# Patient Record
Sex: Male | Born: 1945 | Race: White | Marital: Married
Health system: Southern US, Community
[De-identification: ages and names within clinical notes are randomized; demographics above are authoritative.]

## PROBLEM LIST (undated history)

## (undated) DIAGNOSIS — E785 Hyperlipidemia, unspecified: Secondary | ICD-10-CM

## (undated) DIAGNOSIS — J449 Chronic obstructive pulmonary disease, unspecified: Secondary | ICD-10-CM

## (undated) DIAGNOSIS — I10 Essential (primary) hypertension: Secondary | ICD-10-CM

## (undated) DIAGNOSIS — I2510 Atherosclerotic heart disease of native coronary artery without angina pectoris: Secondary | ICD-10-CM

## (undated) DIAGNOSIS — E119 Type 2 diabetes mellitus without complications: Secondary | ICD-10-CM

## (undated) DIAGNOSIS — I7121 Aneurysm of the ascending aorta, without rupture: Secondary | ICD-10-CM

## (undated) DIAGNOSIS — N182 Chronic kidney disease, stage 2 (mild): Secondary | ICD-10-CM

## (undated) DIAGNOSIS — I712 Thoracic aortic aneurysm, without rupture: Secondary | ICD-10-CM

## (undated) HISTORY — DX: Type 2 diabetes mellitus without complications: E11.9

## (undated) HISTORY — DX: Hyperlipidemia, unspecified: E78.5

## (undated) HISTORY — DX: Chronic obstructive pulmonary disease, unspecified: J44.9

## (undated) HISTORY — DX: Essential (primary) hypertension: I10

---

## 2003-11-15 ENCOUNTER — Ambulatory Visit: Payer: Self-pay | Admitting: Family Medicine

## 2003-12-13 ENCOUNTER — Ambulatory Visit: Payer: Self-pay | Admitting: Family Medicine

## 2003-12-14 ENCOUNTER — Ambulatory Visit: Payer: Self-pay | Admitting: Family Medicine

## 2004-01-04 ENCOUNTER — Ambulatory Visit: Payer: Self-pay | Admitting: Family Medicine

## 2004-03-06 ENCOUNTER — Ambulatory Visit: Payer: Self-pay | Admitting: Family Medicine

## 2004-04-12 ENCOUNTER — Ambulatory Visit: Payer: Self-pay | Admitting: Family Medicine

## 2004-05-10 ENCOUNTER — Ambulatory Visit: Payer: Self-pay | Admitting: Family Medicine

## 2004-06-12 ENCOUNTER — Ambulatory Visit: Payer: Self-pay | Admitting: Family Medicine

## 2004-08-23 ENCOUNTER — Ambulatory Visit: Payer: Self-pay | Admitting: Family Medicine

## 2004-10-16 ENCOUNTER — Ambulatory Visit: Payer: Self-pay | Admitting: Family Medicine

## 2004-11-19 ENCOUNTER — Ambulatory Visit: Payer: Self-pay | Admitting: Family Medicine

## 2004-12-11 ENCOUNTER — Ambulatory Visit: Payer: Self-pay | Admitting: Family Medicine

## 2004-12-25 ENCOUNTER — Ambulatory Visit: Payer: Self-pay | Admitting: Family Medicine

## 2005-02-25 ENCOUNTER — Ambulatory Visit: Payer: Self-pay | Admitting: Family Medicine

## 2005-03-10 ENCOUNTER — Ambulatory Visit: Payer: Self-pay | Admitting: Family Medicine

## 2010-03-04 ENCOUNTER — Other Ambulatory Visit: Payer: Self-pay

## 2010-03-04 DIAGNOSIS — M25551 Pain in right hip: Secondary | ICD-10-CM

## 2010-03-04 DIAGNOSIS — M79604 Pain in right leg: Secondary | ICD-10-CM

## 2010-03-04 DIAGNOSIS — M545 Low back pain, unspecified: Secondary | ICD-10-CM

## 2010-03-06 ENCOUNTER — Other Ambulatory Visit: Payer: Self-pay

## 2010-03-06 ENCOUNTER — Ambulatory Visit
Admission: RE | Admit: 2010-03-06 | Discharge: 2010-03-06 | Disposition: A | Discharge status: Home / Self Care | Payer: Medicare HMO | Source: Ambulatory Visit

## 2010-03-06 DIAGNOSIS — M545 Low back pain: Secondary | ICD-10-CM

## 2010-03-06 DIAGNOSIS — M79604 Pain in right leg: Secondary | ICD-10-CM

## 2010-03-06 DIAGNOSIS — M25551 Pain in right hip: Secondary | ICD-10-CM

## 2010-03-20 ENCOUNTER — Ambulatory Visit
Admission: RE | Admit: 2010-03-20 | Discharge: 2010-03-20 | Disposition: A | Discharge status: Home / Self Care | Payer: Medicare HMO | Source: Ambulatory Visit

## 2010-03-20 DIAGNOSIS — M545 Low back pain: Secondary | ICD-10-CM

## 2010-03-26 ENCOUNTER — Other Ambulatory Visit: Payer: Medicare HMO

## 2010-08-01 DIAGNOSIS — G8929 Other chronic pain: Secondary | ICD-10-CM

## 2010-08-01 DIAGNOSIS — M545 Low back pain, unspecified: Secondary | ICD-10-CM

## 2010-08-01 HISTORY — DX: Other chronic pain: G89.29

## 2010-08-01 HISTORY — DX: Low back pain, unspecified: M54.50

## 2015-05-08 DIAGNOSIS — E039 Hypothyroidism, unspecified: Secondary | ICD-10-CM | POA: Insufficient documentation

## 2015-05-08 DIAGNOSIS — E782 Mixed hyperlipidemia: Secondary | ICD-10-CM | POA: Insufficient documentation

## 2015-05-08 DIAGNOSIS — H409 Unspecified glaucoma: Secondary | ICD-10-CM

## 2015-05-08 DIAGNOSIS — J4520 Mild intermittent asthma, uncomplicated: Secondary | ICD-10-CM

## 2015-05-08 DIAGNOSIS — K219 Gastro-esophageal reflux disease without esophagitis: Secondary | ICD-10-CM

## 2015-05-08 DIAGNOSIS — I10 Essential (primary) hypertension: Secondary | ICD-10-CM

## 2015-05-08 DIAGNOSIS — G2581 Restless legs syndrome: Secondary | ICD-10-CM

## 2015-05-08 DIAGNOSIS — E119 Type 2 diabetes mellitus without complications: Secondary | ICD-10-CM

## 2015-05-08 DIAGNOSIS — Z794 Long term (current) use of insulin: Secondary | ICD-10-CM

## 2015-05-08 HISTORY — DX: Mild intermittent asthma, uncomplicated: J45.20

## 2015-05-08 HISTORY — DX: Restless legs syndrome: G25.81

## 2015-05-08 HISTORY — DX: Long term (current) use of insulin: Z79.4

## 2015-05-08 HISTORY — DX: Essential (primary) hypertension: I10

## 2015-05-08 HISTORY — DX: Gastro-esophageal reflux disease without esophagitis: K21.9

## 2015-05-08 HISTORY — DX: Type 2 diabetes mellitus without complications: E11.9

## 2015-05-08 HISTORY — DX: Mixed hyperlipidemia: E78.2

## 2015-05-08 HISTORY — DX: Unspecified glaucoma: H40.9

## 2015-05-08 HISTORY — DX: Hypothyroidism, unspecified: E03.9

## 2015-11-20 DIAGNOSIS — Z9989 Dependence on other enabling machines and devices: Secondary | ICD-10-CM | POA: Insufficient documentation

## 2015-11-20 DIAGNOSIS — G4733 Obstructive sleep apnea (adult) (pediatric): Secondary | ICD-10-CM

## 2015-11-20 HISTORY — DX: Obstructive sleep apnea (adult) (pediatric): G47.33

## 2016-02-16 DIAGNOSIS — N529 Male erectile dysfunction, unspecified: Secondary | ICD-10-CM | POA: Insufficient documentation

## 2016-02-16 HISTORY — DX: Male erectile dysfunction, unspecified: N52.9

## 2017-02-11 DIAGNOSIS — A419 Sepsis, unspecified organism: Secondary | ICD-10-CM

## 2017-02-11 DIAGNOSIS — R7881 Bacteremia: Secondary | ICD-10-CM

## 2017-02-11 DIAGNOSIS — E1165 Type 2 diabetes mellitus with hyperglycemia: Secondary | ICD-10-CM

## 2017-02-11 DIAGNOSIS — R4182 Altered mental status, unspecified: Secondary | ICD-10-CM | POA: Diagnosis not present

## 2017-02-12 DIAGNOSIS — E1165 Type 2 diabetes mellitus with hyperglycemia: Secondary | ICD-10-CM | POA: Diagnosis not present

## 2017-02-12 DIAGNOSIS — R4182 Altered mental status, unspecified: Secondary | ICD-10-CM | POA: Diagnosis not present

## 2017-02-12 DIAGNOSIS — A419 Sepsis, unspecified organism: Secondary | ICD-10-CM | POA: Diagnosis not present

## 2017-02-12 DIAGNOSIS — R7881 Bacteremia: Secondary | ICD-10-CM | POA: Diagnosis not present

## 2017-02-13 DIAGNOSIS — R4182 Altered mental status, unspecified: Secondary | ICD-10-CM | POA: Diagnosis not present

## 2017-02-13 DIAGNOSIS — E1165 Type 2 diabetes mellitus with hyperglycemia: Secondary | ICD-10-CM | POA: Diagnosis not present

## 2017-02-13 DIAGNOSIS — R7881 Bacteremia: Secondary | ICD-10-CM | POA: Diagnosis not present

## 2017-02-13 DIAGNOSIS — A419 Sepsis, unspecified organism: Secondary | ICD-10-CM | POA: Diagnosis not present

## 2017-02-14 DIAGNOSIS — A419 Sepsis, unspecified organism: Secondary | ICD-10-CM | POA: Diagnosis not present

## 2017-02-14 DIAGNOSIS — R4182 Altered mental status, unspecified: Secondary | ICD-10-CM | POA: Diagnosis not present

## 2017-02-14 DIAGNOSIS — E1165 Type 2 diabetes mellitus with hyperglycemia: Secondary | ICD-10-CM | POA: Diagnosis not present

## 2017-02-14 DIAGNOSIS — R7881 Bacteremia: Secondary | ICD-10-CM | POA: Diagnosis not present

## 2017-08-26 DIAGNOSIS — J449 Chronic obstructive pulmonary disease, unspecified: Secondary | ICD-10-CM | POA: Diagnosis not present

## 2017-08-26 DIAGNOSIS — E1165 Type 2 diabetes mellitus with hyperglycemia: Secondary | ICD-10-CM

## 2017-08-26 DIAGNOSIS — M069 Rheumatoid arthritis, unspecified: Secondary | ICD-10-CM

## 2017-08-26 DIAGNOSIS — W19XXXA Unspecified fall, initial encounter: Secondary | ICD-10-CM

## 2017-08-26 DIAGNOSIS — I10 Essential (primary) hypertension: Secondary | ICD-10-CM | POA: Diagnosis not present

## 2017-08-26 DIAGNOSIS — E039 Hypothyroidism, unspecified: Secondary | ICD-10-CM | POA: Diagnosis not present

## 2017-08-26 DIAGNOSIS — A419 Sepsis, unspecified organism: Secondary | ICD-10-CM

## 2017-08-26 DIAGNOSIS — G4733 Obstructive sleep apnea (adult) (pediatric): Secondary | ICD-10-CM | POA: Diagnosis not present

## 2017-08-27 DIAGNOSIS — M6282 Rhabdomyolysis: Secondary | ICD-10-CM | POA: Diagnosis not present

## 2017-08-27 DIAGNOSIS — D72829 Elevated white blood cell count, unspecified: Secondary | ICD-10-CM

## 2017-08-27 DIAGNOSIS — W19XXXA Unspecified fall, initial encounter: Secondary | ICD-10-CM | POA: Diagnosis not present

## 2017-08-27 DIAGNOSIS — R262 Difficulty in walking, not elsewhere classified: Secondary | ICD-10-CM | POA: Diagnosis not present

## 2017-08-28 DIAGNOSIS — M6282 Rhabdomyolysis: Secondary | ICD-10-CM | POA: Diagnosis not present

## 2017-08-28 DIAGNOSIS — D72829 Elevated white blood cell count, unspecified: Secondary | ICD-10-CM | POA: Diagnosis not present

## 2017-08-28 DIAGNOSIS — R262 Difficulty in walking, not elsewhere classified: Secondary | ICD-10-CM | POA: Diagnosis not present

## 2017-08-28 DIAGNOSIS — W19XXXA Unspecified fall, initial encounter: Secondary | ICD-10-CM | POA: Diagnosis not present

## 2017-09-03 DIAGNOSIS — E860 Dehydration: Secondary | ICD-10-CM

## 2017-09-03 HISTORY — DX: Dehydration: E86.0

## 2018-02-24 DIAGNOSIS — R609 Edema, unspecified: Secondary | ICD-10-CM

## 2018-02-24 DIAGNOSIS — R011 Cardiac murmur, unspecified: Secondary | ICD-10-CM | POA: Insufficient documentation

## 2018-02-24 HISTORY — DX: Edema, unspecified: R60.9

## 2018-02-24 HISTORY — DX: Cardiac murmur, unspecified: R01.1

## 2018-03-17 DIAGNOSIS — L03115 Cellulitis of right lower limb: Secondary | ICD-10-CM

## 2018-03-17 HISTORY — DX: Cellulitis of right lower limb: L03.115

## 2018-03-18 DIAGNOSIS — D72829 Elevated white blood cell count, unspecified: Secondary | ICD-10-CM

## 2018-03-18 DIAGNOSIS — L03115 Cellulitis of right lower limb: Secondary | ICD-10-CM

## 2018-03-18 DIAGNOSIS — A419 Sepsis, unspecified organism: Secondary | ICD-10-CM | POA: Diagnosis not present

## 2018-03-18 DIAGNOSIS — I509 Heart failure, unspecified: Secondary | ICD-10-CM

## 2018-03-18 DIAGNOSIS — I959 Hypotension, unspecified: Secondary | ICD-10-CM

## 2018-03-18 DIAGNOSIS — N179 Acute kidney failure, unspecified: Secondary | ICD-10-CM | POA: Diagnosis not present

## 2018-03-18 DIAGNOSIS — E1169 Type 2 diabetes mellitus with other specified complication: Secondary | ICD-10-CM

## 2018-03-18 DIAGNOSIS — R000 Tachycardia, unspecified: Secondary | ICD-10-CM | POA: Diagnosis not present

## 2018-03-19 DIAGNOSIS — E039 Hypothyroidism, unspecified: Secondary | ICD-10-CM

## 2018-03-19 DIAGNOSIS — R4182 Altered mental status, unspecified: Secondary | ICD-10-CM

## 2018-03-19 DIAGNOSIS — E1169 Type 2 diabetes mellitus with other specified complication: Secondary | ICD-10-CM | POA: Diagnosis not present

## 2018-03-19 DIAGNOSIS — I509 Heart failure, unspecified: Secondary | ICD-10-CM | POA: Diagnosis not present

## 2018-03-19 DIAGNOSIS — L03115 Cellulitis of right lower limb: Secondary | ICD-10-CM | POA: Diagnosis not present

## 2018-03-19 DIAGNOSIS — J449 Chronic obstructive pulmonary disease, unspecified: Secondary | ICD-10-CM

## 2018-03-19 DIAGNOSIS — M71161 Other infective bursitis, right knee: Secondary | ICD-10-CM

## 2018-03-19 DIAGNOSIS — I10 Essential (primary) hypertension: Secondary | ICD-10-CM

## 2018-03-19 DIAGNOSIS — N179 Acute kidney failure, unspecified: Secondary | ICD-10-CM | POA: Diagnosis not present

## 2018-03-20 DIAGNOSIS — N179 Acute kidney failure, unspecified: Secondary | ICD-10-CM | POA: Diagnosis not present

## 2018-03-20 DIAGNOSIS — E1169 Type 2 diabetes mellitus with other specified complication: Secondary | ICD-10-CM | POA: Diagnosis not present

## 2018-03-20 DIAGNOSIS — I509 Heart failure, unspecified: Secondary | ICD-10-CM | POA: Diagnosis not present

## 2018-03-20 DIAGNOSIS — L03115 Cellulitis of right lower limb: Secondary | ICD-10-CM | POA: Diagnosis not present

## 2018-03-21 DIAGNOSIS — N179 Acute kidney failure, unspecified: Secondary | ICD-10-CM | POA: Diagnosis not present

## 2018-03-21 DIAGNOSIS — I509 Heart failure, unspecified: Secondary | ICD-10-CM | POA: Diagnosis not present

## 2018-03-21 DIAGNOSIS — L03115 Cellulitis of right lower limb: Secondary | ICD-10-CM | POA: Diagnosis not present

## 2018-03-21 DIAGNOSIS — R6521 Severe sepsis with septic shock: Secondary | ICD-10-CM | POA: Diagnosis not present

## 2018-03-21 DIAGNOSIS — E1169 Type 2 diabetes mellitus with other specified complication: Secondary | ICD-10-CM | POA: Diagnosis not present

## 2018-03-22 DIAGNOSIS — I509 Heart failure, unspecified: Secondary | ICD-10-CM | POA: Diagnosis not present

## 2018-03-22 DIAGNOSIS — E1169 Type 2 diabetes mellitus with other specified complication: Secondary | ICD-10-CM | POA: Diagnosis not present

## 2018-03-22 DIAGNOSIS — N179 Acute kidney failure, unspecified: Secondary | ICD-10-CM | POA: Diagnosis not present

## 2018-03-22 DIAGNOSIS — L03115 Cellulitis of right lower limb: Secondary | ICD-10-CM | POA: Diagnosis not present

## 2018-03-23 DIAGNOSIS — N179 Acute kidney failure, unspecified: Secondary | ICD-10-CM | POA: Diagnosis not present

## 2018-03-23 DIAGNOSIS — I509 Heart failure, unspecified: Secondary | ICD-10-CM | POA: Diagnosis not present

## 2018-03-23 DIAGNOSIS — L03115 Cellulitis of right lower limb: Secondary | ICD-10-CM | POA: Diagnosis not present

## 2018-03-23 DIAGNOSIS — E1169 Type 2 diabetes mellitus with other specified complication: Secondary | ICD-10-CM | POA: Diagnosis not present

## 2018-03-24 DIAGNOSIS — N179 Acute kidney failure, unspecified: Secondary | ICD-10-CM | POA: Diagnosis not present

## 2018-03-24 DIAGNOSIS — L03115 Cellulitis of right lower limb: Secondary | ICD-10-CM | POA: Diagnosis not present

## 2018-03-24 DIAGNOSIS — I509 Heart failure, unspecified: Secondary | ICD-10-CM | POA: Diagnosis not present

## 2018-03-24 DIAGNOSIS — E1169 Type 2 diabetes mellitus with other specified complication: Secondary | ICD-10-CM | POA: Diagnosis not present

## 2018-04-02 DIAGNOSIS — R000 Tachycardia, unspecified: Secondary | ICD-10-CM

## 2018-04-02 DIAGNOSIS — I358 Other nonrheumatic aortic valve disorders: Secondary | ICD-10-CM

## 2018-04-02 HISTORY — DX: Other nonrheumatic aortic valve disorders: I35.8

## 2018-04-02 HISTORY — DX: Tachycardia, unspecified: R00.0

## 2018-04-15 DIAGNOSIS — M71161 Other infective bursitis, right knee: Secondary | ICD-10-CM

## 2018-04-15 DIAGNOSIS — N179 Acute kidney failure, unspecified: Secondary | ICD-10-CM

## 2018-04-15 HISTORY — DX: Other infective bursitis, right knee: M71.161

## 2018-04-15 HISTORY — DX: Acute kidney failure, unspecified: N17.9

## 2018-04-16 DIAGNOSIS — R600 Localized edema: Secondary | ICD-10-CM

## 2018-04-16 HISTORY — DX: Localized edema: R60.0

## 2018-05-20 DIAGNOSIS — Z20822 Contact with and (suspected) exposure to covid-19: Secondary | ICD-10-CM

## 2018-05-20 HISTORY — DX: Contact with and (suspected) exposure to covid-19: Z20.822

## 2019-03-14 ENCOUNTER — Encounter: Payer: Self-pay | Admitting: *Deleted

## 2019-03-15 ENCOUNTER — Encounter: Payer: Self-pay | Admitting: Cardiology

## 2019-03-16 ENCOUNTER — Encounter: Payer: Self-pay | Admitting: *Deleted

## 2019-03-17 ENCOUNTER — Encounter: Payer: Self-pay | Admitting: Cardiology

## 2019-03-17 ENCOUNTER — Ambulatory Visit (INDEPENDENT_AMBULATORY_CARE_PROVIDER_SITE_OTHER): Payer: Medicare Other | Admitting: Cardiology

## 2019-03-17 ENCOUNTER — Other Ambulatory Visit: Payer: Self-pay

## 2019-03-17 VITALS — BP 132/82 | HR 70 | Ht 67.0 in | Wt 243.0 lb

## 2019-03-17 DIAGNOSIS — I10 Essential (primary) hypertension: Secondary | ICD-10-CM | POA: Diagnosis not present

## 2019-03-17 DIAGNOSIS — R0609 Other forms of dyspnea: Secondary | ICD-10-CM | POA: Insufficient documentation

## 2019-03-17 DIAGNOSIS — G4733 Obstructive sleep apnea (adult) (pediatric): Secondary | ICD-10-CM | POA: Diagnosis not present

## 2019-03-17 DIAGNOSIS — R011 Cardiac murmur, unspecified: Secondary | ICD-10-CM

## 2019-03-17 DIAGNOSIS — Z9989 Dependence on other enabling machines and devices: Secondary | ICD-10-CM

## 2019-03-17 DIAGNOSIS — E782 Mixed hyperlipidemia: Secondary | ICD-10-CM

## 2019-03-17 DIAGNOSIS — R0600 Dyspnea, unspecified: Secondary | ICD-10-CM

## 2019-03-17 DIAGNOSIS — R600 Localized edema: Secondary | ICD-10-CM

## 2019-03-17 HISTORY — DX: Other forms of dyspnea: R06.09

## 2019-03-17 HISTORY — DX: Morbid (severe) obesity due to excess calories: E66.01

## 2019-03-17 HISTORY — DX: Dyspnea, unspecified: R06.00

## 2019-03-17 NOTE — Patient Instructions (Addendum)
Medication Instructions:  No medication changes *If you need a refill on your cardiac medications before your next appointment, please call your pharmacy*   Lab Work: You will need to have BMET 1 week prior to your CT. You can come Monday through Friday 8:30 am to 12:00 pm and 1:15 to 4:30. You do not need to make an appointment as the order has already been placed. If you have labs (blood work) drawn today and your tests are completely normal, you will receive your results only by: Marland Kitchen MyChart Message (if you have MyChart) OR . A paper copy in the mail If you have any lab test that is abnormal or we need to change your treatment, we will call you to review the results.   Testing/Procedures: Your physician has requested that you have an echocardiogram. Echocardiography is a painless test that uses sound waves to create images of your heart. It provides your doctor with information about the size and shape of your heart and how well your heart's chambers and valves are working. This procedure takes approximately one hour. There are no restrictions for this procedure.  Your physician has requested that you have cardiac CT. Cardiac computed tomography (CT) is a painless test that uses an x-ray machine to take clear, detailed pictures of your heart. For further information please visit [email or web address]. Please follow instruction sheet as given.     Follow-Up: At [facility], you and your health needs are our priority.  As part of our continuing mission to provide you with exceptional heart care, we have created designated Provider Care Teams.  These Care Teams include your primary Cardiologist (physician) and Advanced Practice Providers (APPs -  Physician Assistants and Nurse Practitioners) who all work together to provide you with the care you need, when you need it.  We recommend signing up for the patient portal called "MyChart".  Sign up information is provided on this After Visit  Summary.  MyChart is used to connect with patients for Virtual Visits (Telemedicine).  Patients are able to view lab/test results, encounter notes, upcoming appointments, etc.  Non-urgent messages can be sent to your provider as well.   To learn more about what you can do with MyChart, go to [email or web address].    Your next appointment:   3 month(s)  The format for your next appointment:   In Person  Provider:   Jyl Heinz, MD   Other Instructions  Your cardiac CT will be scheduled at one of the below locations:   [facility] [address] [place], [place] [address] [phone]  [facility], please arrive at the [facility] main entrance of [facility] 30 minutes prior to test start time. Proceed to the [facility] Radiology Department (first floor) to check-in and test prep.   Please follow these instructions carefully (unless otherwise directed):  Hold all erectile dysfunction medications at least 3 days (72 hrs) prior to test.  On the Night Before the Test: . Be sure to Drink plenty of water. . Do not consume any caffeinated/decaffeinated beverages or chocolate 12 hours prior to your test. . Do not take any antihistamines 12 hours prior to your test. . If you take Metformin do not take 24 hours prior to test.  On the Day of the Test: . Drink plenty of water. Do not drink any water within one hour of the test. . Do not eat any food 4 hours prior to the test. . You may take your  regular medications prior to the test.  . Take metoprolol (Lopressor) two hours prior to test. . HOLD Furosemide/Hydrochlorothiazide morning of the test. . FEMALES- please wear underwire-free bra if available       After the Test: . Drink plenty of water. . After receiving IV contrast, you may experience a mild flushed feeling. This is normal. . On occasion, you may experience a mild rash up to 24 hours after the test. This is not dangerous. If this  occurs, you can take Benadryl 25 mg and increase your fluid intake. . If you experience trouble breathing, this can be serious. If it is severe call 911 IMMEDIATELY. If it is mild, please call our office. . If you take any of these medications: Glipizide/Metformin, Avandament, Glucavance, please do not take 48 hours after completing test unless otherwise instructed.   Once we have confirmed authorization from your insurance company, we will call you to set up a date and time for your test.   For non-scheduling related questions, please contact the cardiac imaging nurse navigator should you have any questions/concerns: Marchia Bond, RN Navigator Cardiac Imaging [facility] [phone] office

## 2019-03-17 NOTE — Addendum Note (Signed)
Addended by: Truddie Hidden on: 03/17/2019 03:01 PM   Modules accepted: Orders

## 2019-03-17 NOTE — Progress Notes (Signed)
Cardiology Office Note:    Date:  03/17/2019   ID:  Robert Peterson, DOB [birth year], MRN [id number]  PCP:  Algis Greenhouse, MD  Cardiologist:  Jenean Lindau, MD   Referring MD: Gardiner Rhyme, MD    ASSESSMENT:    1. Essential hypertension   2. Mixed hyperlipidemia   3. Bilateral lower extremity edema   4. OSA on CPAP   5. DOE (dyspnea on exertion)   6. Morbid obesity ([place])    PLAN:    In order of problems listed above:  1. Dyspnea on exertion: This could be multifactorial.  He sees a pulmonologist for lung issues.  Patient also mentions to me that he is overweight and he does not ambulate much and this could be a factor.  However he has multiple risk factors for coronary artery disease which are very significant and we want to make sure that the symptoms are not anginal equivalents.  I discussed CT coronary angiography with FFR and he is agreeable.  We will schedule him for this.  In the interim of asked him not to exert himself more than usual. Echocardiogram will be done to assess murmur heard on auscultation. Essential hypertension: Blood pressure stable Mixed dyslipidemia: Lipids managed and followed by primary care physician. Obesity: Weight reduction was stressed and the patient promises to do so. Patient will be seen in follow-up appointment in 2 months or earlier if the patient has any concerns.  In the interim if he has any significant concerns he knows to go to the nearest emergency room.  Medication Adjustments/Labs and Tests Ordered: Current medicines are reviewed at length with the patient today.  Concerns regarding medicines are outlined above.  No orders of the defined types were placed in this encounter.  No orders of the defined types were placed in this encounter.    History of Present Illness:    Robert Peterson is a 74 y.o. male who is being seen today for the evaluation of dyspnea on exertion at the request of Gardiner Rhyme, MD.  Patient is  a pleasant 74 year old male.  He has past medical history of essential hypertension dyslipidemia.  He has sleep apnea.  He mentions to me that recently he has noticed shortness of breath on exertion.  This is consistently getting worse.  He is also diabetic.  No orthopnea or PND.  He has some chest tightness at times.  He is a poor historian.  For the same reason he does not exercise on a regular basis.  At the time of my evaluation, the patient is alert awake oriented and in no distress.  Past Medical History:  Diagnosis Date  . Acute kidney injury ([place]) 04/15/2018   2020: dehydration related to septic bursitis, hosp; minor changes  . Aortic valve sclerosis 04/02/2018  . Bilateral lower extremity edema 04/16/2018  . Cellulitis of right lower extremity 03/17/2018  . Chronic lumbar pain 08/01/2010  . COPD (chronic obstructive pulmonary disease) ([place])   . Diabetes ([place])   . Erectile dysfunction 02/16/2016  . Essential hypertension 05/08/2015   1991: dx  . Gastroesophageal reflux disease without esophagitis 05/08/2015  . Glaucoma 05/08/2015  . Hyperlipidemia   . Hypertension   . Hypothyroidism 05/08/2015  . Mild intermittent asthma without complication 123XX123  . Mixed hyperlipidemia 05/08/2015  . Murmur 02/24/2018  . OSA (obstructive sleep apnea)   . OSA on CPAP 11/20/2015  . Restless leg syndrome 05/08/2015  . Septic prepatellar bursitis of right knee 04/15/2018  2020: hosp, MRSA, I/D  . Swelling 02/24/2018  . Tachycardia 04/02/2018  . Type 2 diabetes mellitus, with long-term current use of insulin ([place]) 05/08/2015   2002: dx 6.1 XXXX: CKD3    History reviewed. No pertinent surgical history.  Current Medications: Current Meds  Medication Sig  . atorvastatin (LIPITOR) 80 MG tablet Take by mouth.  . fluticasone furoate-vilanterol (BREO ELLIPTA) 100-25 MCG/INH AEPB Inhale 1 puff into the lungs daily.  . insulin degludec (TRESIBA FLEXTOUCH) 100 UNIT/ML SOPN FlexTouch Pen Inject into the skin.  Marland Kitchen  irbesartan (AVAPRO) 300 MG tablet Take 300 mg by mouth every morning.  Marland Kitchen levothyroxine (SYNTHROID) 75 MCG tablet TAKE 1 TABLET DAILY FOR THYROID.  Marland Kitchen metFORMIN (GLUCOPHAGE) 1000 MG tablet TAKE ONE TABLET BY MOUTH TWICE DAILY WITH MEALS FOR DIABETES  . metoprolol succinate (TOPROL-XL) 50 MG 24 hr tablet SMARTSIG:1 Tablet(s) By Mouth Every Morning  . omeprazole (PRILOSEC) 20 MG capsule TAKE ONE CAPSULE BY MOUTH DAILY FOR reflux  . spironolactone (ALDACTONE) 25 MG tablet TAKE ONE TABLET BY MOUTH EVERY MORNING FOR swelling  . [DISCONTINUED] fluticasone furoate-vilanterol (BREO ELLIPTA) 200-25 MCG/INH AEPB Inhale 1 puff into the lungs daily.  . [DISCONTINUED] metoprolol succinate (TOPROL-XL) 50 MG 24 hr tablet Take by mouth.     Allergies:   Patient has no known allergies.   Social History   Socioeconomic History  . Marital status: Married    Spouse name: Not on file  . Number of children: Not on file  . Years of education: Not on file  . Highest education level: Not on file  Occupational History  . Not on file  Tobacco Use  . Smoking status: Never Smoker  . Smokeless tobacco: Never Used  Substance and Sexual Activity  . Alcohol use: Never  . Drug use: Never  . Sexual activity: Not on file  Other Topics Concern  . Not on file  Social History Narrative  . Not on file   Social Determinants of Health   Financial Resource Strain:   . Difficulty of Paying Living Expenses: Not on file  Food Insecurity:   . Worried About Charity fundraiser in the Last Year: Not on file  . Ran Out of Food in the Last Year: Not on file  Transportation Needs:   . Lack of Transportation (Medical): Not on file  . Lack of Transportation (Non-Medical): Not on file  Physical Activity:   . Days of Exercise per Week: Not on file  . Minutes of Exercise per Session: Not on file  Stress:   . Feeling of Stress : Not on file  Social Connections:   . Frequency of Communication with Friends and Family: Not on  file  . Frequency of Social Gatherings with Friends and Family: Not on file  . Attends Religious Services: Not on file  . Active Member of Clubs or Organizations: Not on file  . Attends Archivist Meetings: Not on file  . Marital Status: Not on file     Family History: The patient's family history is not on file.  ROS:   Please see the history of present illness.    All other systems reviewed and are negative.  EKGs/Labs/Other Studies Reviewed:    The following studies were reviewed today: EKG was sinus rhythm and nonspecific ST-T changes and LVH.   Recent Labs: No results found for requested labs within last 8760 hours.  Recent Lipid Panel No results found for: CHOL, TRIG, HDL, CHOLHDL, VLDL, LDLCALC, LDLDIRECT  Physical Exam:    VS:  BP 132/82   Pulse 70   Ht 5\' 7"  (1.702 m)   Wt 243 lb (110.2 kg)   SpO2 94%   BMI 38.06 kg/m     Wt Readings from Last 3 Encounters:  03/17/19 243 lb (110.2 kg)  03/10/19 241 lb (109.3 kg)     GEN: Patient is in no acute distress HEENT: Normal NECK: No JVD; No carotid bruits LYMPHATICS: No lymphadenopathy CARDIAC: S1 S2 regular, 2/6 systolic murmur at the apex. RESPIRATORY:  Clear to auscultation without rales, wheezing or rhonchi  ABDOMEN: Soft, non-tender, non-distended MUSCULOSKELETAL:  No edema; No deformity  SKIN: Warm and dry NEUROLOGIC:  Alert and oriented x 3 PSYCHIATRIC:  Normal affect    Signed, Jenean Lindau, MD  03/17/2019 2:36 PM    [facility]

## 2019-04-06 ENCOUNTER — Other Ambulatory Visit: Payer: Self-pay

## 2019-04-06 DIAGNOSIS — I10 Essential (primary) hypertension: Secondary | ICD-10-CM

## 2019-04-06 DIAGNOSIS — E782 Mixed hyperlipidemia: Secondary | ICD-10-CM

## 2019-04-06 LAB — BASIC METABOLIC PANEL
BUN/Creatinine Ratio: 18 (ref 10–24)
BUN: 27 mg/dL (ref 8–27)
CO2: 24 mmol/L (ref 20–29)
Calcium: 9.5 mg/dL (ref 8.6–10.2)
Chloride: 95 mmol/L — ABNORMAL LOW (ref 96–106)
Creatinine, Ser: 1.51 mg/dL — ABNORMAL HIGH (ref 0.76–1.27)
GFR calc Af Amer: 51 mL/min/{1.73_m2} — ABNORMAL LOW (ref >59)
GFR calc non Af Amer: 45 mL/min/{1.73_m2} — ABNORMAL LOW (ref >59)
Glucose: 324 mg/dL — ABNORMAL HIGH (ref 65–99)
Potassium: 5.2 mmol/L (ref 3.5–5.2)
Sodium: 135 mmol/L (ref 134–144)

## 2019-04-15 ENCOUNTER — Telehealth (HOSPITAL_COMMUNITY): Payer: Self-pay | Admitting: Emergency Medicine

## 2019-04-15 NOTE — Telephone Encounter (Signed)
Reaching out to patient to offer assistance regarding upcoming cardiac imaging study; pt verbalizes understanding of appt date/time, parking situation and where to check in, pre-test NPO status and medications ordered, and verified current allergies; name and call back number provided for further questions should they arise Marchia Bond RN [place] [facility] [phone] office [phone] cell   Spoke with wife since patient wasn't home at the time of call. Wife was able to write down instructions : holding metformin 24hr prior/48hr post test, holding spironolactone day of test.   Wife appreciated the call. Gave callback number if patient had further questions  Clarise Cruz

## 2019-04-18 ENCOUNTER — Ambulatory Visit (HOSPITAL_COMMUNITY)
Admission: RE | Admit: 2019-04-18 | Discharge: 2019-04-18 | Disposition: A | Discharge status: Home / Self Care | Payer: Medicare Other | Source: Ambulatory Visit | Attending: Cardiology | Admitting: Cardiology

## 2019-04-18 ENCOUNTER — Other Ambulatory Visit: Payer: Self-pay

## 2019-04-18 DIAGNOSIS — I77810 Thoracic aortic ectasia: Secondary | ICD-10-CM | POA: Insufficient documentation

## 2019-04-18 DIAGNOSIS — N62 Hypertrophy of breast: Secondary | ICD-10-CM | POA: Insufficient documentation

## 2019-04-18 DIAGNOSIS — R0609 Other forms of dyspnea: Secondary | ICD-10-CM | POA: Insufficient documentation

## 2019-04-18 DIAGNOSIS — I2510 Atherosclerotic heart disease of native coronary artery without angina pectoris: Secondary | ICD-10-CM | POA: Insufficient documentation

## 2019-04-18 MED ORDER — IOHEXOL 350 MG/ML SOLN
100.0000 mL | Freq: Once | INTRAVENOUS | Status: AC | PRN
  Administered 2019-04-18: 100 mL via INTRAVENOUS

## 2019-04-18 MED ORDER — NITROGLYCERIN 0.4 MG SL SUBL
SUBLINGUAL_TABLET | SUBLINGUAL | Status: AC
  Filled 2019-04-18: qty 2

## 2019-04-19 DIAGNOSIS — I2510 Atherosclerotic heart disease of native coronary artery without angina pectoris: Secondary | ICD-10-CM | POA: Diagnosis not present

## 2019-04-20 ENCOUNTER — Other Ambulatory Visit: Payer: Self-pay

## 2019-04-20 MED ORDER — NITROGLYCERIN 0.4 MG SL SUBL: 0.4000 mg | SUBLINGUAL_TABLET | SUBLINGUAL | 6 refills | Status: DC | PRN

## 2019-04-20 MED ORDER — ASPIRIN EC 81 MG PO TBEC: 81.0000 mg | DELAYED_RELEASE_TABLET | Freq: Every day | ORAL | 3 refills | Status: DC

## 2019-04-20 NOTE — Addendum Note (Signed)
Addended by: Truddie Hidden on: 04/20/2019 03:24 PM   Modules accepted: Orders

## 2019-04-21 ENCOUNTER — Other Ambulatory Visit: Payer: Self-pay

## 2019-04-25 ENCOUNTER — Encounter: Payer: Self-pay | Admitting: Cardiology

## 2019-04-25 ENCOUNTER — Other Ambulatory Visit: Payer: Self-pay

## 2019-04-25 ENCOUNTER — Ambulatory Visit (INDEPENDENT_AMBULATORY_CARE_PROVIDER_SITE_OTHER): Payer: Medicare Other | Admitting: Cardiology

## 2019-04-25 VITALS — BP 164/88 | HR 64 | Temp 97.0°F | Ht 67.0 in | Wt 242.0 lb

## 2019-04-25 DIAGNOSIS — I25118 Atherosclerotic heart disease of native coronary artery with other forms of angina pectoris: Secondary | ICD-10-CM

## 2019-04-25 DIAGNOSIS — E782 Mixed hyperlipidemia: Secondary | ICD-10-CM

## 2019-04-25 DIAGNOSIS — R0609 Other forms of dyspnea: Secondary | ICD-10-CM

## 2019-04-25 DIAGNOSIS — E1100 Type 2 diabetes mellitus with hyperosmolarity without nonketotic hyperglycemic-hyperosmolar coma (NKHHC): Secondary | ICD-10-CM

## 2019-04-25 DIAGNOSIS — R0600 Dyspnea, unspecified: Secondary | ICD-10-CM

## 2019-04-25 DIAGNOSIS — N289 Disorder of kidney and ureter, unspecified: Secondary | ICD-10-CM

## 2019-04-25 DIAGNOSIS — Z794 Long term (current) use of insulin: Secondary | ICD-10-CM

## 2019-04-25 DIAGNOSIS — I10 Essential (primary) hypertension: Secondary | ICD-10-CM | POA: Diagnosis not present

## 2019-04-25 DIAGNOSIS — I2510 Atherosclerotic heart disease of native coronary artery without angina pectoris: Secondary | ICD-10-CM

## 2019-04-25 DIAGNOSIS — I209 Angina pectoris, unspecified: Secondary | ICD-10-CM

## 2019-04-25 HISTORY — DX: Angina pectoris, unspecified: I20.9

## 2019-04-25 HISTORY — DX: Disorder of kidney and ureter, unspecified: N28.9

## 2019-04-25 HISTORY — DX: Atherosclerotic heart disease of native coronary artery with other forms of angina pectoris: I25.118

## 2019-04-25 NOTE — H&P (View-Only) (Signed)
Cardiology Office Note:    Date:  04/25/2019   ID:  Robert Peterson, [place] 05-26-1945, MRN [id number]  PCP:  Robert Greenhouse, MD  Cardiologist:  Robert Lindau, MD   Referring MD: Robert Greenhouse, MD    ASSESSMENT:    1. Angina pectoris ([place])   2. Essential hypertension   3. Coronary artery disease involving native coronary artery of native heart, angina presence unspecified   4. Mixed hyperlipidemia   5. DOE (dyspnea on exertion)   6. Morbid obesity ([place])   7. Type 2 diabetes mellitus with hyperosmolarity without coma, with long-term current use of insulin ([place])   8. Renal insufficiency    PLAN:    In order of problems listed above:  1. Angina pectoris: Patient symptoms are very concerning.  I discussed my findings with him at extensive length.I discussed coronary angiography and left heart catheterization with the patient at extensive length. Procedure, benefits and potential risks were explained. Patient had multiple questions which were answered to the patient's satisfaction. Patient agreed and consented for the procedure. Further recommendations will be made based on the findings of the coronary angiography. In the interim. The patient has any significant symptoms he knows to go to the nearest emergency room. 2. Chronic renal insufficiency: This will need to be considered in the process of coronary angiography.  We will do blood work again on him today.  I am not sure whether he needs a left ventriculography but I will leave the final decision to my invasive colleagues to make that decision. 3. Essential hypertension: Blood pressure is elevated I feel this is an element of whitecoat hypertension.  Diet was discussed.  Diet was also discussed for dyslipidemia and diabetes mellitus and we will manage this more aggressively when I see him in follow-up appointment after coronary angiography.  He knows to go to the nearest emergency room for any concerning symptoms.  He and his  family member had multiple questions which were answered to their satisfaction.   Medication Adjustments/Labs and Tests Ordered: Current medicines are reviewed at length with the patient today.  Concerns regarding medicines are outlined above.  No orders of the defined types were placed in this encounter.  No orders of the defined types were placed in this encounter.    No chief complaint on file.    History of Present Illness:    Robert Peterson is a 74 y.o. male.  Patient has past medical history of essential hypertension dyslipidemia diabetes mellitus and he is overweight.  He was evaluated for anginal-like symptoms and the CT coronary angiography is significantly abnormal and coronary angiography was recommended.  The complete report is mentioned below.  He mentions to me that he has some chest tightness on exertion.  He has now started using aspirin on a daily basis and also knows to use sublingual nitroglycerin.  His family member accompanies him for this visit.  He is anxious today and his blood pressure is elevated.  At the time of my evaluation, the patient is alert awake oriented and in no distress.  Past Medical History:  Diagnosis Date  . Acute kidney injury ([place]) 04/15/2018   2020: dehydration related to septic bursitis, hosp; minor changes  . Aortic valve sclerosis 04/02/2018  . Bilateral lower extremity edema 04/16/2018  . Cellulitis of right lower extremity 03/17/2018  . Chronic lumbar pain 08/01/2010  . COPD (chronic obstructive pulmonary disease) ([place])   . Diabetes ([place])   . Erectile dysfunction  02/16/2016  . Essential hypertension 05/08/2015   1991: dx  . Gastroesophageal reflux disease without esophagitis 05/08/2015  . Glaucoma 05/08/2015  . Hyperlipidemia   . Hypertension   . Hypothyroidism 05/08/2015  . Mild intermittent asthma without complication 123XX123  . Mixed hyperlipidemia 05/08/2015  . Murmur 02/24/2018  . OSA (obstructive sleep apnea)   . OSA on CPAP  11/20/2015  . Restless leg syndrome 05/08/2015  . Septic prepatellar bursitis of right knee 04/15/2018   2020: hosp, MRSA, I/D  . Swelling 02/24/2018  . Tachycardia 04/02/2018  . Type 2 diabetes mellitus, with long-term current use of insulin ([place]) 05/08/2015   2002: dx 6.1 XXXX: CKD3    History reviewed. No pertinent surgical history.  Current Medications: Current Meds  Medication Sig  . aspirin EC 81 MG tablet Take 1 tablet (81 mg total) by mouth daily.  Marland Kitchen atorvastatin (LIPITOR) 80 MG tablet Take by mouth.  . fluticasone furoate-vilanterol (BREO ELLIPTA) 100-25 MCG/INH AEPB Inhale 1 puff into the lungs daily.  Marland Kitchen HYDROcodone-acetaminophen (NORCO/VICODIN) 5-325 MG tablet Take 1 tablet by mouth as needed.  . insulin degludec (TRESIBA FLEXTOUCH) 100 UNIT/ML FlexTouch Pen Inject 45 Units into the skin daily.  . irbesartan (AVAPRO) 300 MG tablet Take 300 mg by mouth every morning.  . latanoprost (XALATAN) 0.005 % ophthalmic solution Place 1 drop into both eyes at bedtime.  Marland Kitchen levothyroxine (SYNTHROID) 75 MCG tablet TAKE 1 TABLET DAILY FOR THYROID.  Marland Kitchen metFORMIN (GLUCOPHAGE) 1000 MG tablet TAKE ONE TABLET BY MOUTH TWICE DAILY WITH MEALS FOR DIABETES  . metoprolol succinate (TOPROL-XL) 50 MG 24 hr tablet SMARTSIG:1 Tablet(s) By Mouth Every Morning  . nitroGLYCERIN (NITROSTAT) 0.4 MG SL tablet Place 1 tablet (0.4 mg total) under the tongue every 5 (five) minutes as needed for chest pain.  Marland Kitchen omeprazole (PRILOSEC) 20 MG capsule TAKE ONE CAPSULE BY MOUTH DAILY FOR reflux  . spironolactone (ALDACTONE) 25 MG tablet TAKE ONE TABLET BY MOUTH EVERY MORNING FOR swelling     Allergies:   Patient has no known allergies.   Social History   Socioeconomic History  . Marital status: Married    Spouse name: Not on file  . Number of children: Not on file  . Years of education: Not on file  . Highest education level: Not on file  Occupational History  . Not on file  Tobacco Use  . Smoking status: Never  Smoker  . Smokeless tobacco: Never Used  Substance and Sexual Activity  . Alcohol use: Never  . Drug use: Never  . Sexual activity: Not on file  Other Topics Concern  . Not on file  Social History Narrative  . Not on file   Social Determinants of Health   Financial Resource Strain:   . Difficulty of Paying Living Expenses:   Food Insecurity:   . Worried About Charity fundraiser in the Last Year:   . Arboriculturist in the Last Year:   Transportation Needs:   . Film/video editor (Medical):   Marland Kitchen Lack of Transportation (Non-Medical):   Physical Activity:   . Days of Exercise per Week:   . Minutes of Exercise per Session:   Stress:   . Feeling of Stress :   Social Connections:   . Frequency of Communication with Friends and Family:   . Frequency of Social Gatherings with Friends and Family:   . Attends Religious Services:   . Active Member of Clubs or Organizations:   . Attends Club  or Organization Meetings:   Marland Kitchen Marital Status:      Family History: The patient's family history is not on file.  ROS:   Please see the history of present illness.    All other systems reviewed and are negative.  EKGs/Labs/Other Studies Reviewed:    The following studies were reviewed today: CLINICAL DATA:  CAD  EXAM: FFR CT  TECHNIQUE: The best systolic and diastolic phases of the patients gated cardiac CTA sent for HeartFlow for hemodynamic analysis  FINDINGS: FFR CT positive in proximal LAD 0.75  FFR CT positive in D1 0.73  FFR CT borderline in IM 0.89  FFR CT positive  in AV circumflex 0.77  FFR CT positive in very distal PDA/PLB 0.79  IMPRESSION: FFR CT positive see above most concerning in proximal mid and distal LAD  Patient should be referred for heart catheterization  Jenkins Rouge   Electronically Signed   By: Jenkins Rouge M.D.   On: 04/19/2019 10:54   Recent Labs: 04/06/2019: BUN 27; Creatinine, Ser 1.51; Potassium 5.2; Sodium 135    Recent Lipid Panel No results found for: CHOL, TRIG, HDL, CHOLHDL, VLDL, LDLCALC, LDLDIRECT  Physical Exam:    VS:  BP (!) 164/88   Pulse 64   Temp (!) 97 F (36.1 C)   Ht 5\' 7"  (1.702 m)   Wt 242 lb (109.8 kg)   SpO2 95%   BMI 37.90 kg/m     Wt Readings from Last 3 Encounters:  04/25/19 242 lb (109.8 kg)  03/17/19 243 lb (110.2 kg)  03/10/19 241 lb (109.3 kg)     GEN: Patient is in no acute distress HEENT: Normal NECK: No JVD; No carotid bruits LYMPHATICS: No lymphadenopathy CARDIAC: Hear sounds regular, 2/6 systolic murmur at the apex. RESPIRATORY:  Clear to auscultation without rales, wheezing or rhonchi  ABDOMEN: Soft, non-tender, non-distended MUSCULOSKELETAL:  No edema; No deformity  SKIN: Warm and dry NEUROLOGIC:  Alert and oriented x 3 PSYCHIATRIC:  Normal affect   Signed, Robert Lindau, MD  04/25/2019 10:09 AM    [place]

## 2019-04-25 NOTE — Progress Notes (Signed)
Cardiology Office Note:    Date:  04/25/2019   ID:  Robert Peterson, [place] 05-Mar-1945, MRN [id number]  PCP:  Algis Greenhouse, MD  Cardiologist:  Jenean Lindau, MD   Referring MD: Algis Greenhouse, MD    ASSESSMENT:    1. Angina pectoris ([place])   2. Essential hypertension   3. Coronary artery disease involving native coronary artery of native heart, angina presence unspecified   4. Mixed hyperlipidemia   5. DOE (dyspnea on exertion)   6. Morbid obesity ([place])   7. Type 2 diabetes mellitus with hyperosmolarity without coma, with long-term current use of insulin ([place])   8. Renal insufficiency    PLAN:    In order of problems listed above:  1. Angina pectoris: Patient symptoms are very concerning.  I discussed my findings with him at extensive length.I discussed coronary angiography and left heart catheterization with the patient at extensive length. Procedure, benefits and potential risks were explained. Patient had multiple questions which were answered to the patient's satisfaction. Patient agreed and consented for the procedure. Further recommendations will be made based on the findings of the coronary angiography. In the interim. The patient has any significant symptoms he knows to go to the nearest emergency room. 2. Chronic renal insufficiency: This will need to be considered in the process of coronary angiography.  We will do blood work again on him today.  I am not sure whether he needs a left ventriculography but I will leave the final decision to my invasive colleagues to make that decision. 3. Essential hypertension: Blood pressure is elevated I feel this is an element of whitecoat hypertension.  Diet was discussed.  Diet was also discussed for dyslipidemia and diabetes mellitus and we will manage this more aggressively when I see him in follow-up appointment after coronary angiography.  He knows to go to the nearest emergency room for any concerning symptoms.  He and his  family member had multiple questions which were answered to their satisfaction.   Medication Adjustments/Labs and Tests Ordered: Current medicines are reviewed at length with the patient today.  Concerns regarding medicines are outlined above.  No orders of the defined types were placed in this encounter.  No orders of the defined types were placed in this encounter.    No chief complaint on file.    History of Present Illness:    Robert Peterson is a 74 y.o. male.  Patient has past medical history of essential hypertension dyslipidemia diabetes mellitus and he is overweight.  He was evaluated for anginal-like symptoms and the CT coronary angiography is significantly abnormal and coronary angiography was recommended.  The complete report is mentioned below.  He mentions to me that he has some chest tightness on exertion.  He has now started using aspirin on a daily basis and also knows to use sublingual nitroglycerin.  His family member accompanies him for this visit.  He is anxious today and his blood pressure is elevated.  At the time of my evaluation, the patient is alert awake oriented and in no distress.  Past Medical History:  Diagnosis Date  . Acute kidney injury ([place]) 04/15/2018   2020: dehydration related to septic bursitis, hosp; minor changes  . Aortic valve sclerosis 04/02/2018  . Bilateral lower extremity edema 04/16/2018  . Cellulitis of right lower extremity 03/17/2018  . Chronic lumbar pain 08/01/2010  . COPD (chronic obstructive pulmonary disease) ([place])   . Diabetes ([place])   . Erectile dysfunction  02/16/2016  . Essential hypertension 05/08/2015   1991: dx  . Gastroesophageal reflux disease without esophagitis 05/08/2015  . Glaucoma 05/08/2015  . Hyperlipidemia   . Hypertension   . Hypothyroidism 05/08/2015  . Mild intermittent asthma without complication [id number]  . Mixed hyperlipidemia 05/08/2015  . Murmur 02/24/2018  . OSA (obstructive sleep apnea)   . OSA on CPAP  11/20/2015  . Restless leg syndrome 05/08/2015  . Septic prepatellar bursitis of right knee 04/15/2018   2020: hosp, MRSA, I/D  . Swelling 02/24/2018  . Tachycardia 04/02/2018  . Type 2 diabetes mellitus, with long-term current use of insulin ([place]) 05/08/2015   2002: dx 6.1 XXXX: CKD3    History reviewed. No pertinent surgical history.  Current Medications: Current Meds  Medication Sig  . aspirin EC 81 MG tablet Take 1 tablet (81 mg total) by mouth daily.  Marland Kitchen atorvastatin (LIPITOR) 80 MG tablet Take by mouth.  . fluticasone furoate-vilanterol (BREO ELLIPTA) 100-25 MCG/INH AEPB Inhale 1 puff into the lungs daily.  Marland Kitchen HYDROcodone-acetaminophen (NORCO/VICODIN) 5-325 MG tablet Take 1 tablet by mouth as needed.  . insulin degludec (TRESIBA FLEXTOUCH) 100 UNIT/ML FlexTouch Pen Inject 45 Units into the skin daily.  . irbesartan (AVAPRO) 300 MG tablet Take 300 mg by mouth every morning.  . latanoprost (XALATAN) 0.005 % ophthalmic solution Place 1 drop into both eyes at bedtime.  Marland Kitchen levothyroxine (SYNTHROID) 75 MCG tablet TAKE 1 TABLET DAILY FOR THYROID.  Marland Kitchen metFORMIN (GLUCOPHAGE) 1000 MG tablet TAKE ONE TABLET BY MOUTH TWICE DAILY WITH MEALS FOR DIABETES  . metoprolol succinate (TOPROL-XL) 50 MG 24 hr tablet SMARTSIG:1 Tablet(s) By Mouth Every Morning  . nitroGLYCERIN (NITROSTAT) 0.4 MG SL tablet Place 1 tablet (0.4 mg total) under the tongue every 5 (five) minutes as needed for chest pain.  Marland Kitchen omeprazole (PRILOSEC) 20 MG capsule TAKE ONE CAPSULE BY MOUTH DAILY FOR reflux  . spironolactone (ALDACTONE) 25 MG tablet TAKE ONE TABLET BY MOUTH EVERY MORNING FOR swelling     Allergies:   Patient has no known allergies.   Social History   Socioeconomic History  . Marital status: Married    Spouse name: Not on file  . Number of children: Not on file  . Years of education: Not on file  . Highest education level: Not on file  Occupational History  . Not on file  Tobacco Use  . Smoking status: Never  Smoker  . Smokeless tobacco: Never Used  Substance and Sexual Activity  . Alcohol use: Never  . Drug use: Never  . Sexual activity: Not on file  Other Topics Concern  . Not on file  Social History Narrative  . Not on file   Social Determinants of Health   Financial Resource Strain:   . Difficulty of Paying Living Expenses:   Food Insecurity:   . Worried About Charity fundraiser in the Last Year:   . Arboriculturist in the Last Year:   Transportation Needs:   . Film/video editor (Medical):   Marland Kitchen Lack of Transportation (Non-Medical):   Physical Activity:   . Days of Exercise per Week:   . Minutes of Exercise per Session:   Stress:   . Feeling of Stress :   Social Connections:   . Frequency of Communication with Friends and Family:   . Frequency of Social Gatherings with Friends and Family:   . Attends Religious Services:   . Active Member of Clubs or Organizations:   . Attends Club  or Organization Meetings:   Marland Kitchen Marital Status:      Family History: The patient's family history is not on file.  ROS:   Please see the history of present illness.    All other systems reviewed and are negative.  EKGs/Labs/Other Studies Reviewed:    The following studies were reviewed today: CLINICAL DATA:  CAD  EXAM: FFR CT  TECHNIQUE: The best systolic and diastolic phases of the patients gated cardiac CTA sent for HeartFlow for hemodynamic analysis  FINDINGS: FFR CT positive in proximal LAD 0.75  FFR CT positive in D1 0.73  FFR CT borderline in IM 0.89  FFR CT positive  in AV circumflex 0.77  FFR CT positive in very distal PDA/PLB 0.79  IMPRESSION: FFR CT positive see above most concerning in proximal mid and distal LAD  Patient should be referred for heart catheterization  Jenkins Rouge   Electronically Signed   By: Jenkins Rouge M.D.   On: 04/19/2019 10:54   Recent Labs: 04/06/2019: BUN 27; Creatinine, Ser 1.51; Potassium 5.2; Sodium 135    Recent Lipid Panel No results found for: CHOL, TRIG, HDL, CHOLHDL, VLDL, LDLCALC, LDLDIRECT  Physical Exam:    VS:  BP (!) 164/88   Pulse 64   Temp (!) 97 F (36.1 C)   Ht 5\' 7"  (1.702 m)   Wt 242 lb (109.8 kg)   SpO2 95%   BMI 37.90 kg/m     Wt Readings from Last 3 Encounters:  04/25/19 242 lb (109.8 kg)  03/17/19 243 lb (110.2 kg)  03/10/19 241 lb (109.3 kg)     GEN: Patient is in no acute distress HEENT: Normal NECK: No JVD; No carotid bruits LYMPHATICS: No lymphadenopathy CARDIAC: Hear sounds regular, 2/6 systolic murmur at the apex. RESPIRATORY:  Clear to auscultation without rales, wheezing or rhonchi  ABDOMEN: Soft, non-tender, non-distended MUSCULOSKELETAL:  No edema; No deformity  SKIN: Warm and dry NEUROLOGIC:  Alert and oriented x 3 PSYCHIATRIC:  Normal affect   Signed, Jenean Lindau, MD  04/25/2019 10:09 AM    [place]

## 2019-04-25 NOTE — Patient Instructions (Signed)
Medication Instructions:  No medication changes *If you need a refill on your cardiac medications before your next appointment, please call your pharmacy*   Lab Work: Your physician recommends that you have a BMET and CBC today.  If you have labs (blood work) drawn today and your tests are completely normal, you will receive your results only by: Marland Kitchen MyChart Message (if you have MyChart) OR . A paper copy in the mail If you have any lab test that is abnormal or we need to change your treatment, we will call you to review the results.   Testing/Procedures:    [place] [address] Dept: [phone] Loc: [place]  04/25/2019  You are scheduled for a Cardiac Catheterization on Friday, April 16 with Dr. Kathlyn Sacramento.  1. Please arrive at the [facility] (Main Entrance A) at [facility]: [address] [place], [place] [address] at 8:30 AM (This time is two hours before your procedure to ensure your preparation). Free valet parking service is available.   Special note: Every effort is made to have your procedure done on time. Please understand that emergencies sometimes delay scheduled procedures.  2. Diet: Do not eat solid foods after midnight.  The patient may have clear liquids until 5am upon the day of the procedure.  3. Labs: You had your labs done today.  4. Medication instructions in preparation for your procedure:   Contrast Allergy: No  Stop taking, Avapro (Irbesartan) Thursday, April 15,  Do not take Diabetes Med Glucophage (Metformin) on the day of the procedure and HOLD 48 HOURS AFTER THE PROCEDURE.  On the morning of your procedure, take your Aspirin and any morning medicines NOT listed above.  You may use sips of water.  5. Plan for one night stay--bring personal belongings. 6. Bring a current list of your medications and  current insurance cards. 7. You MUST have a responsible person to drive you home. 8. Someone MUST be with you the first 24 hours after you arrive home or your discharge will be delayed. 9. Please wear clothes that are easy to get on and off and wear slip-on shoes.  Thank you for allowing [place] to care for you!   -- [facility]   Follow-Up: At [facility], you and your health needs are our priority.  As part of our continuing mission to provide you with exceptional heart care, we have created designated Provider Care Teams.  These Care Teams include your primary Cardiologist (physician) and Advanced Practice Providers (APPs -  Physician Assistants and Nurse Practitioners) who all work together to provide you with the care you need, when you need it.  We recommend signing up for the patient portal called "MyChart".  Sign up information is provided on this After Visit Summary.  MyChart is used to connect with patients for Virtual Visits (Telemedicine).  Patients are able to view lab/test results, encounter notes, upcoming appointments, etc.  Non-urgent messages can be sent to your provider as well.   To learn more about what you can do with MyChart, go to [email or web address].    Your next appointment:   1 month(s)  The format for your next appointment:   In Person  Provider:   Jyl Heinz, MD   Other Instructions NA

## 2019-04-26 ENCOUNTER — Ambulatory Visit (INDEPENDENT_AMBULATORY_CARE_PROVIDER_SITE_OTHER): Payer: Medicare Other

## 2019-04-26 DIAGNOSIS — R011 Cardiac murmur, unspecified: Secondary | ICD-10-CM

## 2019-04-26 LAB — BASIC METABOLIC PANEL
BUN/Creatinine Ratio: 19 (ref 10–24)
BUN: 22 mg/dL (ref 8–27)
CO2: 18 mmol/L — ABNORMAL LOW (ref 20–29)
Calcium: 9.7 mg/dL (ref 8.6–10.2)
Chloride: 99 mmol/L (ref 96–106)
Creatinine, Ser: 1.15 mg/dL (ref 0.76–1.27)
GFR calc Af Amer: 72 mL/min/{1.73_m2} (ref >59)
GFR calc non Af Amer: 62 mL/min/{1.73_m2} (ref >59)
Glucose: 330 mg/dL — ABNORMAL HIGH (ref 65–99)
Potassium: 4.7 mmol/L (ref 3.5–5.2)
Sodium: 132 mmol/L — ABNORMAL LOW (ref 134–144)

## 2019-04-26 LAB — CBC WITH DIFFERENTIAL/PLATELET
Basophils Absolute: 0 10*3/uL (ref 0.0–0.2)
Basos: 1 %
EOS (ABSOLUTE): 0.6 10*3/uL — ABNORMAL HIGH (ref 0.0–0.4)
Eos: 8 %
Hematocrit: 44.4 % (ref 37.5–51.0)
Hemoglobin: 14.9 g/dL (ref 13.0–17.7)
Immature Grans (Abs): 0 10*3/uL (ref 0.0–0.1)
Immature Granulocytes: 0 %
Lymphocytes Absolute: 1.5 10*3/uL (ref 0.7–3.1)
Lymphs: 19 %
MCH: 29.9 pg (ref 26.6–33.0)
MCHC: 33.6 g/dL (ref 31.5–35.7)
MCV: 89 fL (ref 79–97)
Monocytes Absolute: 0.7 10*3/uL (ref 0.1–0.9)
Monocytes: 10 %
Neutrophils Absolute: 4.9 10*3/uL (ref 1.4–7.0)
Neutrophils: 62 %
Platelets: 223 10*3/uL (ref 150–450)
RBC: 4.98 x10E6/uL (ref 4.14–5.80)
RDW: 12.7 % (ref 11.6–15.4)
WBC: 7.8 10*3/uL (ref 3.4–10.8)

## 2019-04-26 NOTE — Progress Notes (Deleted)
Complete echocardiogram has been performed.  Jimmy Rel; RDCS, RVT 

## 2019-04-26 NOTE — Progress Notes (Signed)
Complete echocardiogram performed.  Jimmy Zoha Spranger RDCS, RVT  

## 2019-04-27 ENCOUNTER — Other Ambulatory Visit (HOSPITAL_COMMUNITY)
Admission: RE | Admit: 2019-04-27 | Discharge: 2019-04-27 | Disposition: A | Discharge status: Home / Self Care | Payer: Medicare Other | Source: Ambulatory Visit | Attending: Cardiovascular Disease | Admitting: Cardiovascular Disease

## 2019-04-27 ENCOUNTER — Telehealth: Payer: Self-pay | Admitting: Emergency Medicine

## 2019-04-27 DIAGNOSIS — Z20822 Contact with and (suspected) exposure to covid-19: Secondary | ICD-10-CM | POA: Diagnosis not present

## 2019-04-27 DIAGNOSIS — Z01812 Encounter for preprocedural laboratory examination: Secondary | ICD-10-CM | POA: Diagnosis present

## 2019-04-27 LAB — SARS CORONAVIRUS 2 (TAT 6-24 HRS): SARS Coronavirus 2: NEGATIVE

## 2019-04-27 NOTE — Telephone Encounter (Signed)
Left message for patient to return call.

## 2019-04-28 ENCOUNTER — Telehealth: Payer: Self-pay | Admitting: *Deleted

## 2019-04-28 NOTE — Telephone Encounter (Addendum)
Call placed to patient and patient's daughter (DPR), they are aware ot time change for procedure from 10:30 AM to 9 AM, arrive 7 AM.

## 2019-04-28 NOTE — Telephone Encounter (Addendum)
Pt contacted pre-catheterization scheduled at [facility] for: Friday April 29, 2019 9 AM Verified arrival time and place: [facility]) at: 7 AM   No solid food after midnight prior to cath, clear liquids until 5 AM day of procedure.  Hold: Metformin-day of procedure and 48 hours post procedure. Tresiba-1/2 PM prior to procedurere. Spironolactone-AM of procedure  Except hold medications AM meds can be  taken pre-cath with sip of water including: ASA 81 mg   Confirmed patient has responsible adult to drive home post procedure and observe 24 hours after arriving home: yes  Currently, due to Covid-19 pandemic, only one person will be allowed with patient. Must be the same person for patient's entire stay and will be required to wear a mask. They will be asked to wait in the waiting room for the duration of the patient's stay.  Patients are required to wear a mask when they enter the hospital.      COVID-19 Pre-Screening Questions:  . In the past 7 to 10 days have you had a cough,  shortness of breath, headache, congestion, fever (100 or greater) body aches, chills, sore throat, or sudden loss of taste or sense of smell? no . Have you been around anyone with known Covid 19 in the past 7 to 10 days? no . Have you been around anyone who is awaiting Covid 19 test results in the past 7 to 10 days? no . Have you been around anyone who  has mentioned symptoms of Covid 19 within the past 7 to 10 days? no   I reviewed procedure/mask/visitor instructions, COVID-19 screening questions with patient and patient's daughter (DPR), Lattie Haw.

## 2019-04-29 ENCOUNTER — Other Ambulatory Visit: Payer: Self-pay

## 2019-04-29 ENCOUNTER — Encounter (HOSPITAL_COMMUNITY)
Admission: RE | Disposition: A | Discharge status: Home / Self Care | Payer: Self-pay | Source: Home / Self Care | Attending: Cardiovascular Disease

## 2019-04-29 ENCOUNTER — Ambulatory Visit (HOSPITAL_COMMUNITY)
Admission: RE | Admit: 2019-04-29 | Discharge: 2019-04-30 | Disposition: A | Discharge status: Home / Self Care | Payer: Medicare Other | Attending: Cardiovascular Disease | Admitting: Cardiovascular Disease

## 2019-04-29 DIAGNOSIS — Z7982 Long term (current) use of aspirin: Secondary | ICD-10-CM | POA: Insufficient documentation

## 2019-04-29 DIAGNOSIS — H409 Unspecified glaucoma: Secondary | ICD-10-CM | POA: Insufficient documentation

## 2019-04-29 DIAGNOSIS — Z79899 Other long term (current) drug therapy: Secondary | ICD-10-CM | POA: Diagnosis not present

## 2019-04-29 DIAGNOSIS — Z7951 Long term (current) use of inhaled steroids: Secondary | ICD-10-CM | POA: Diagnosis not present

## 2019-04-29 DIAGNOSIS — Z794 Long term (current) use of insulin: Secondary | ICD-10-CM | POA: Insufficient documentation

## 2019-04-29 DIAGNOSIS — I2510 Atherosclerotic heart disease of native coronary artery without angina pectoris: Secondary | ICD-10-CM

## 2019-04-29 DIAGNOSIS — I208 Other forms of angina pectoris: Secondary | ICD-10-CM

## 2019-04-29 DIAGNOSIS — Z79890 Hormone replacement therapy (postmenopausal): Secondary | ICD-10-CM | POA: Insufficient documentation

## 2019-04-29 DIAGNOSIS — E039 Hypothyroidism, unspecified: Secondary | ICD-10-CM | POA: Diagnosis not present

## 2019-04-29 DIAGNOSIS — E1100 Type 2 diabetes mellitus with hyperosmolarity without nonketotic hyperglycemic-hyperosmolar coma (NKHHC): Secondary | ICD-10-CM

## 2019-04-29 DIAGNOSIS — N182 Chronic kidney disease, stage 2 (mild): Secondary | ICD-10-CM | POA: Diagnosis not present

## 2019-04-29 DIAGNOSIS — E1122 Type 2 diabetes mellitus with diabetic chronic kidney disease: Secondary | ICD-10-CM | POA: Diagnosis not present

## 2019-04-29 DIAGNOSIS — I2089 Other forms of angina pectoris: Secondary | ICD-10-CM

## 2019-04-29 DIAGNOSIS — Z6836 Body mass index (BMI) 36.0-36.9, adult: Secondary | ICD-10-CM | POA: Diagnosis not present

## 2019-04-29 DIAGNOSIS — R0609 Other forms of dyspnea: Secondary | ICD-10-CM | POA: Diagnosis not present

## 2019-04-29 DIAGNOSIS — G4733 Obstructive sleep apnea (adult) (pediatric): Secondary | ICD-10-CM | POA: Insufficient documentation

## 2019-04-29 DIAGNOSIS — I129 Hypertensive chronic kidney disease with stage 1 through stage 4 chronic kidney disease, or unspecified chronic kidney disease: Secondary | ICD-10-CM | POA: Insufficient documentation

## 2019-04-29 DIAGNOSIS — G2581 Restless legs syndrome: Secondary | ICD-10-CM | POA: Diagnosis not present

## 2019-04-29 DIAGNOSIS — J449 Chronic obstructive pulmonary disease, unspecified: Secondary | ICD-10-CM | POA: Insufficient documentation

## 2019-04-29 DIAGNOSIS — E119 Type 2 diabetes mellitus without complications: Secondary | ICD-10-CM

## 2019-04-29 DIAGNOSIS — E782 Mixed hyperlipidemia: Secondary | ICD-10-CM | POA: Diagnosis not present

## 2019-04-29 DIAGNOSIS — I712 Thoracic aortic aneurysm, without rupture: Secondary | ICD-10-CM | POA: Insufficient documentation

## 2019-04-29 DIAGNOSIS — Z955 Presence of coronary angioplasty implant and graft: Secondary | ICD-10-CM

## 2019-04-29 DIAGNOSIS — K219 Gastro-esophageal reflux disease without esophagitis: Secondary | ICD-10-CM | POA: Diagnosis not present

## 2019-04-29 DIAGNOSIS — I25118 Atherosclerotic heart disease of native coronary artery with other forms of angina pectoris: Secondary | ICD-10-CM | POA: Insufficient documentation

## 2019-04-29 DIAGNOSIS — N289 Disorder of kidney and ureter, unspecified: Secondary | ICD-10-CM

## 2019-04-29 DIAGNOSIS — I209 Angina pectoris, unspecified: Secondary | ICD-10-CM

## 2019-04-29 DIAGNOSIS — I10 Essential (primary) hypertension: Secondary | ICD-10-CM

## 2019-04-29 HISTORY — DX: Aneurysm of the ascending aorta, without rupture: I71.21

## 2019-04-29 HISTORY — DX: Atherosclerotic heart disease of native coronary artery without angina pectoris: I25.10

## 2019-04-29 HISTORY — DX: Chronic kidney disease, stage 2 (mild): N18.2

## 2019-04-29 HISTORY — PX: LEFT HEART CATH AND CORONARY ANGIOGRAPHY: CATH118249

## 2019-04-29 HISTORY — PX: CORONARY STENT INTERVENTION: CATH118234

## 2019-04-29 HISTORY — DX: Thoracic aortic aneurysm, without rupture: I71.2

## 2019-04-29 HISTORY — DX: Other forms of angina pectoris: I20.89

## 2019-04-29 HISTORY — DX: Other forms of angina pectoris: I20.8

## 2019-04-29 HISTORY — PX: INTRAVASCULAR PRESSURE WIRE/FFR STUDY: CATH118243

## 2019-04-29 LAB — GLUCOSE, CAPILLARY
Glucose-Capillary: 340 mg/dL — ABNORMAL HIGH (ref 70–99)
Glucose-Capillary: 320 mg/dL — ABNORMAL HIGH (ref 70–99)
Glucose-Capillary: 286 mg/dL — ABNORMAL HIGH (ref 70–99)
Glucose-Capillary: 278 mg/dL — ABNORMAL HIGH (ref 70–99)
Glucose-Capillary: 208 mg/dL — ABNORMAL HIGH (ref 70–99)
Glucose-Capillary: 218 mg/dL — ABNORMAL HIGH (ref 70–99)

## 2019-04-29 LAB — HEMOGLOBIN A1C
Hgb A1c MFr Bld: 12.3 % — ABNORMAL HIGH (ref 4.8–5.6)
Mean Plasma Glucose: 306.31 mg/dL

## 2019-04-29 LAB — POCT ACTIVATED CLOTTING TIME: Activated Clotting Time: 252 seconds

## 2019-04-29 SURGERY — LEFT HEART CATH AND CORONARY ANGIOGRAPHY
Anesthesia: LOCAL

## 2019-04-29 MED ORDER — HYDRALAZINE HCL 20 MG/ML IJ SOLN
10.0000 mg | INTRAMUSCULAR | Status: AC | PRN
  Administered 2019-04-29: 10 mg via INTRAVENOUS
  Filled 2019-04-29 (×2): qty 1

## 2019-04-29 MED ORDER — HEPARIN (PORCINE) IN NACL 1000-0.9 UT/500ML-% IV SOLN
INTRAVENOUS | Status: AC
  Filled 2019-04-29: qty 1000

## 2019-04-29 MED ORDER — SODIUM CHLORIDE 0.9 % WEIGHT BASED INFUSION
3.0000 mL/kg/h | INTRAVENOUS | Status: DC
  Administered 2019-04-29: 3 mL/kg/h via INTRAVENOUS

## 2019-04-29 MED ORDER — METOPROLOL TARTRATE 5 MG/5ML IV SOLN: 5.0000 mg | Freq: Once | INTRAVENOUS | Status: DC

## 2019-04-29 MED ORDER — SODIUM CHLORIDE 0.9% FLUSH
3.0000 mL | Freq: Two times a day (BID) | INTRAVENOUS | Status: DC
  Administered 2019-04-29: 3 mL via INTRAVENOUS

## 2019-04-29 MED ORDER — IOHEXOL 350 MG/ML SOLN
INTRAVENOUS | Status: DC | PRN
  Administered 2019-04-29: 180 mL

## 2019-04-29 MED ORDER — IRBESARTAN 150 MG PO TABS
300.0000 mg | ORAL_TABLET | Freq: Every morning | ORAL | Status: DC
  Administered 2019-04-30: 300 mg via ORAL
  Filled 2019-04-29: qty 2

## 2019-04-29 MED ORDER — MIDAZOLAM HCL 2 MG/2ML IJ SOLN
INTRAMUSCULAR | Status: DC | PRN
  Administered 2019-04-29: 1 mg via INTRAVENOUS

## 2019-04-29 MED ORDER — CLOPIDOGREL BISULFATE 300 MG PO TABS
ORAL_TABLET | ORAL | Status: AC
  Filled 2019-04-29: qty 2

## 2019-04-29 MED ORDER — FENTANYL CITRATE (PF) 100 MCG/2ML IJ SOLN
INTRAMUSCULAR | Status: DC | PRN
  Administered 2019-04-29 (×2): 25 ug via INTRAVENOUS

## 2019-04-29 MED ORDER — INSULIN ASPART 100 UNIT/ML ~~LOC~~ SOLN
0.0000 [IU] | Freq: Every day | SUBCUTANEOUS | Status: DC
  Administered 2019-04-30: 2 [IU] via SUBCUTANEOUS

## 2019-04-29 MED ORDER — ONDANSETRON HCL 4 MG/2ML IJ SOLN
INTRAMUSCULAR | Status: AC
  Filled 2019-04-29: qty 2

## 2019-04-29 MED ORDER — LATANOPROST 0.005 % OP SOLN
1.0000 [drp] | Freq: Every day | OPHTHALMIC | Status: DC
  Filled 2019-04-29 (×2): qty 2.5

## 2019-04-29 MED ORDER — NITROGLYCERIN 1 MG/10 ML FOR IR/CATH LAB
INTRA_ARTERIAL | Status: AC
  Filled 2019-04-29: qty 10

## 2019-04-29 MED ORDER — SODIUM CHLORIDE 0.9 % WEIGHT BASED INFUSION: 1.0000 mL/kg/h | INTRAVENOUS | Status: AC

## 2019-04-29 MED ORDER — INSULIN GLARGINE 100 UNIT/ML ~~LOC~~ SOLN
45.0000 [IU] | Freq: Every day | SUBCUTANEOUS | Status: DC
  Administered 2019-04-29 – 2019-04-30 (×2): 45 [IU] via SUBCUTANEOUS
  Filled 2019-04-29 (×2): qty 0.45

## 2019-04-29 MED ORDER — NITROGLYCERIN 1 MG/10 ML FOR IR/CATH LAB
INTRA_ARTERIAL | Status: DC | PRN
  Administered 2019-04-29 (×2): 200 ug via INTRACORONARY

## 2019-04-29 MED ORDER — INSULIN ASPART 100 UNIT/ML ~~LOC~~ SOLN
5.0000 [IU] | Freq: Once | SUBCUTANEOUS | Status: AC
  Administered 2019-04-29: 5 [IU] via SUBCUTANEOUS
  Filled 2019-04-29: qty 0.05

## 2019-04-29 MED ORDER — LIDOCAINE HCL (PF) 1 % IJ SOLN
INTRAMUSCULAR | Status: AC
  Filled 2019-04-29: qty 30

## 2019-04-29 MED ORDER — SPIRONOLACTONE 25 MG PO TABS
25.0000 mg | ORAL_TABLET | Freq: Every day | ORAL | Status: DC
  Administered 2019-04-29 – 2019-04-30 (×2): 25 mg via ORAL
  Filled 2019-04-29 (×2): qty 1

## 2019-04-29 MED ORDER — INSULIN ASPART 100 UNIT/ML ~~LOC~~ SOLN
0.0000 [IU] | Freq: Three times a day (TID) | SUBCUTANEOUS | Status: DC
  Administered 2019-04-30 (×2): 5 [IU] via SUBCUTANEOUS

## 2019-04-29 MED ORDER — SODIUM CHLORIDE 0.9% FLUSH: 3.0000 mL | INTRAVENOUS | Status: DC | PRN

## 2019-04-29 MED ORDER — ATORVASTATIN CALCIUM 40 MG PO TABS
40.0000 mg | ORAL_TABLET | Freq: Every day | ORAL | Status: DC
  Administered 2019-04-29: 40 mg via ORAL
  Filled 2019-04-29: qty 1

## 2019-04-29 MED ORDER — HEPARIN (PORCINE) IN NACL 1000-0.9 UT/500ML-% IV SOLN
INTRAVENOUS | Status: DC | PRN
  Administered 2019-04-29 (×2): 500 mL

## 2019-04-29 MED ORDER — SODIUM CHLORIDE 0.9 % IV SOLN: 250.0000 mL | INTRAVENOUS | Status: DC | PRN

## 2019-04-29 MED ORDER — HEPARIN SODIUM (PORCINE) 1000 UNIT/ML IJ SOLN
INTRAMUSCULAR | Status: DC | PRN
  Administered 2019-04-29: 5000 [IU] via INTRAVENOUS
  Administered 2019-04-29: 6000 [IU] via INTRAVENOUS
  Administered 2019-04-29: 2000 [IU] via INTRAVENOUS

## 2019-04-29 MED ORDER — VERAPAMIL HCL 2.5 MG/ML IV SOLN
INTRAVENOUS | Status: DC | PRN
  Administered 2019-04-29: 10 mL via INTRA_ARTERIAL

## 2019-04-29 MED ORDER — MIDAZOLAM HCL 2 MG/2ML IJ SOLN
INTRAMUSCULAR | Status: AC
  Filled 2019-04-29: qty 2

## 2019-04-29 MED ORDER — ASPIRIN EC 81 MG PO TBEC
81.0000 mg | DELAYED_RELEASE_TABLET | Freq: Every day | ORAL | Status: DC
  Administered 2019-04-30: 12:00:00 81 mg via ORAL
  Filled 2019-04-29: qty 1

## 2019-04-29 MED ORDER — HEPARIN SODIUM (PORCINE) 1000 UNIT/ML IJ SOLN
INTRAMUSCULAR | Status: AC
  Filled 2019-04-29: qty 1

## 2019-04-29 MED ORDER — LEVOTHYROXINE SODIUM 75 MCG PO TABS
75.0000 ug | ORAL_TABLET | Freq: Every day | ORAL | Status: DC
  Administered 2019-04-30: 75 ug via ORAL
  Filled 2019-04-29: qty 1

## 2019-04-29 MED ORDER — FENTANYL CITRATE (PF) 100 MCG/2ML IJ SOLN
INTRAMUSCULAR | Status: AC
  Filled 2019-04-29: qty 2

## 2019-04-29 MED ORDER — FLUTICASONE FUROATE-VILANTEROL 100-25 MCG/INH IN AEPB
1.0000 | INHALATION_SPRAY | Freq: Every day | RESPIRATORY_TRACT | Status: DC
  Administered 2019-04-30: 1 via RESPIRATORY_TRACT
  Filled 2019-04-29: qty 28

## 2019-04-29 MED ORDER — ONDANSETRON HCL 4 MG/2ML IJ SOLN
4.0000 mg | Freq: Four times a day (QID) | INTRAMUSCULAR | Status: DC | PRN
  Administered 2019-04-29: 4 mg via INTRAVENOUS
  Filled 2019-04-29: qty 2

## 2019-04-29 MED ORDER — ACETAMINOPHEN 325 MG PO TABS: 650.0000 mg | ORAL_TABLET | ORAL | Status: DC | PRN

## 2019-04-29 MED ORDER — VERAPAMIL HCL 2.5 MG/ML IV SOLN
INTRAVENOUS | Status: AC
  Filled 2019-04-29: qty 2

## 2019-04-29 MED ORDER — FAMOTIDINE IN NACL 20-0.9 MG/50ML-% IV SOLN
20.0000 mg | Freq: Once | INTRAVENOUS | Status: AC
  Administered 2019-04-29: 20 mg via INTRAVENOUS

## 2019-04-29 MED ORDER — METOPROLOL SUCCINATE ER 50 MG PO TB24
50.0000 mg | ORAL_TABLET | Freq: Every morning | ORAL | Status: DC
  Administered 2019-04-30: 50 mg via ORAL
  Filled 2019-04-29: qty 1

## 2019-04-29 MED ORDER — LIDOCAINE HCL (PF) 1 % IJ SOLN
INTRAMUSCULAR | Status: DC | PRN
  Administered 2019-04-29: 2 mL

## 2019-04-29 MED ORDER — SODIUM CHLORIDE 0.9 % WEIGHT BASED INFUSION
1.0000 mL/kg/h | INTRAVENOUS | Status: DC
  Administered 2019-04-29: 1 mL/kg/h via INTRAVENOUS

## 2019-04-29 MED ORDER — ASPIRIN 81 MG PO CHEW: 81.0000 mg | CHEWABLE_TABLET | ORAL | Status: DC

## 2019-04-29 MED ORDER — CLOPIDOGREL BISULFATE 300 MG PO TABS
ORAL_TABLET | ORAL | Status: DC | PRN
  Administered 2019-04-29: 600 mg via ORAL

## 2019-04-29 MED ORDER — PANTOPRAZOLE SODIUM 40 MG PO TBEC
40.0000 mg | DELAYED_RELEASE_TABLET | Freq: Every day | ORAL | Status: DC
  Administered 2019-04-30: 09:00:00 40 mg via ORAL
  Filled 2019-04-29: qty 1

## 2019-04-29 MED ORDER — HYDRALAZINE HCL 25 MG PO TABS
25.0000 mg | ORAL_TABLET | Freq: Three times a day (TID) | ORAL | Status: DC
  Administered 2019-04-29 – 2019-04-30 (×2): 25 mg via ORAL
  Filled 2019-04-29 (×2): qty 1

## 2019-04-29 MED ORDER — SODIUM CHLORIDE 0.9% FLUSH: 3.0000 mL | Freq: Two times a day (BID) | INTRAVENOUS | Status: DC

## 2019-04-29 MED ORDER — CLOPIDOGREL BISULFATE 75 MG PO TABS
75.0000 mg | ORAL_TABLET | Freq: Every day | ORAL | Status: DC
  Administered 2019-04-30: 75 mg via ORAL
  Filled 2019-04-29: qty 1

## 2019-04-29 MED ORDER — NITROGLYCERIN 0.4 MG SL SUBL: 0.4000 mg | SUBLINGUAL_TABLET | SUBLINGUAL | Status: DC | PRN

## 2019-04-29 SURGICAL SUPPLY — 20 items
BALLN EMERGE MR 2.5X12 (BALLOONS) ×2
BALLN SAPPHIRE ~~LOC~~ 3.0X18 (BALLOONS) ×2 IMPLANT
BALLOON EMERGE MR 2.5X12 (BALLOONS) ×1 IMPLANT
CATH INFINITI 5 FR JL3.5 (CATHETERS) ×2 IMPLANT
CATH INFINITI 5FR JK (CATHETERS) ×2 IMPLANT
CATH INFINITI JR4 5F (CATHETERS) ×2 IMPLANT
CATH VISTA GUIDE 6FR XBLAD3.5 (CATHETERS) ×2 IMPLANT
DEVICE RAD COMP TR BAND LRG (VASCULAR PRODUCTS) ×2 IMPLANT
GLIDESHEATH SLEND SS 6F .021 (SHEATH) ×2 IMPLANT
GUIDEWIRE INQWIRE 1.5J.035X260 (WIRE) ×1 IMPLANT
GUIDEWIRE PRESSURE COMET II (WIRE) ×2 IMPLANT
INQWIRE 1.5J .035X260CM (WIRE) ×2
KIT ENCORE 26 ADVANTAGE (KITS) ×2 IMPLANT
KIT HEART LEFT (KITS) ×2 IMPLANT
PACK CARDIAC CATHETERIZATION (CUSTOM PROCEDURE TRAY) ×2 IMPLANT
STENT RESOLUTE ONYX 2.75X30 (Permanent Stent) ×2 IMPLANT
TRANSDUCER W/STOPCOCK (MISCELLANEOUS) ×2 IMPLANT
TUBING CIL FLEX 10 FLL-RA (TUBING) ×2 IMPLANT
WIRE HI TORQ VERSACORE-J 145CM (WIRE) ×2 IMPLANT
WIRE RUNTHROUGH .014X180CM (WIRE) ×4 IMPLANT

## 2019-04-29 NOTE — Interval H&P Note (Signed)
Cath Lab Visit (complete for each Cath Lab visit)  Clinical Evaluation Leading to the Procedure:   ACS: No.  Non-ACS:    Anginal Classification: CCS III  Anti-ischemic medical therapy: Minimal Therapy (1 class of medications)  Non-Invasive Test Results: High-risk stress test findings: cardiac mortality >3%/year  Prior CABG: No previous CABG      History and Physical Interval Note:  04/29/2019 10:06 AM  Robert Peterson  has presented today for surgery, with the diagnosis of angina.  The various methods of treatment have been discussed with the patient and family. After consideration of risks, benefits and other options for treatment, the patient has consented to  Procedure(s): LEFT HEART CATH AND CORONARY ANGIOGRAPHY (N/A) as a surgical intervention.  The patient's history has been reviewed, patient examined, no change in status, stable for surgery.  I have reviewed the patient's chart and labs.  Questions were answered to the patient's satisfaction.     Kathlyn Sacramento

## 2019-04-29 NOTE — Progress Notes (Signed)
SBP 180-160's, HR mid-50's. Mancel Bale, NP paged x 4, no response.

## 2019-04-29 NOTE — Progress Notes (Signed)
Robert Peterson arrived in cath lab holding area. He was suddenly nauseated and vomiting. Zofran was giver co Dr Fletcher Anon.Dr Fletcher Anon arrived to evaluate pt. 12 lead EKG was completed and Dr Fletcher Anon signed off. Order given for Pepcid and started. Pt is resting comfortably and is feeling better. Plavix was not present in emesis.

## 2019-04-29 NOTE — Progress Notes (Signed)
SBP 180's. [place] NP paged, lopressor ordered. NP paged again concerning low HR. Awaiting return page.  Lopressor discontinued, PO hydralazine ordered at 2200.

## 2019-04-30 ENCOUNTER — Encounter (HOSPITAL_COMMUNITY): Payer: Self-pay | Admitting: Cardiovascular Disease

## 2019-04-30 DIAGNOSIS — I25118 Atherosclerotic heart disease of native coronary artery with other forms of angina pectoris: Secondary | ICD-10-CM | POA: Diagnosis not present

## 2019-04-30 DIAGNOSIS — N182 Chronic kidney disease, stage 2 (mild): Secondary | ICD-10-CM

## 2019-04-30 DIAGNOSIS — E1122 Type 2 diabetes mellitus with diabetic chronic kidney disease: Secondary | ICD-10-CM | POA: Diagnosis not present

## 2019-04-30 DIAGNOSIS — I208 Other forms of angina pectoris: Secondary | ICD-10-CM | POA: Diagnosis not present

## 2019-04-30 DIAGNOSIS — Z794 Long term (current) use of insulin: Secondary | ICD-10-CM | POA: Diagnosis not present

## 2019-04-30 LAB — BASIC METABOLIC PANEL
Anion gap: 8 (ref 5–15)
BUN: 23 mg/dL (ref 8–23)
CO2: 26 mmol/L (ref 22–32)
Calcium: 8.7 mg/dL — ABNORMAL LOW (ref 8.9–10.3)
Chloride: 99 mmol/L (ref 98–111)
Creatinine, Ser: 1.16 mg/dL (ref 0.61–1.24)
GFR calc Af Amer: >60 mL/min (ref >60)
GFR calc non Af Amer: >60 mL/min (ref >60)
Glucose, Bld: 293 mg/dL — ABNORMAL HIGH (ref 70–99)
Potassium: 4 mmol/L (ref 3.5–5.1)
Sodium: 133 mmol/L — ABNORMAL LOW (ref 135–145)

## 2019-04-30 LAB — CBC
HCT: 42.4 % (ref 39.0–52.0)
Hemoglobin: 13.9 g/dL (ref 13.0–17.0)
MCH: 28.8 pg (ref 26.0–34.0)
MCHC: 32.8 g/dL (ref 30.0–36.0)
MCV: 88 fL (ref 80.0–100.0)
Platelets: 205 10*3/uL (ref 150–400)
RBC: 4.82 MIL/uL (ref 4.22–5.81)
RDW: 13 % (ref 11.5–15.5)
WBC: 10.6 10*3/uL — ABNORMAL HIGH (ref 4.0–10.5)
nRBC: 0 % (ref 0.0–0.2)

## 2019-04-30 LAB — GLUCOSE, CAPILLARY
Glucose-Capillary: 238 mg/dL — ABNORMAL HIGH (ref 70–99)
Glucose-Capillary: 258 mg/dL — ABNORMAL HIGH (ref 70–99)
Glucose-Capillary: 269 mg/dL — ABNORMAL HIGH (ref 70–99)

## 2019-04-30 MED ORDER — PANTOPRAZOLE SODIUM 40 MG PO TBEC: 40.0000 mg | DELAYED_RELEASE_TABLET | Freq: Every day | ORAL | 1 refills | Status: AC

## 2019-04-30 MED ORDER — AMLODIPINE BESYLATE 5 MG PO TABS
5.0000 mg | ORAL_TABLET | Freq: Every day | ORAL | Status: DC
  Administered 2019-04-30: 5 mg via ORAL
  Filled 2019-04-30: qty 1

## 2019-04-30 MED ORDER — AMLODIPINE BESYLATE 5 MG PO TABS: 5.0000 mg | ORAL_TABLET | Freq: Every day | ORAL | 5 refills | Status: DC

## 2019-04-30 MED ORDER — CLOPIDOGREL BISULFATE 75 MG PO TABS: 75.0000 mg | ORAL_TABLET | Freq: Every day | ORAL | 11 refills | Status: DC

## 2019-04-30 NOTE — Progress Notes (Signed)
CARDIAC REHAB PHASE I   PRE:  Rate/Rhythm: sinus 95  BP:    Sitting: 157/82     SaO2: 100% RA  MODE:  Ambulation: 400 ft   POST:  Rate/Rhythem: 88  BP:    Sitting: 151/94     SaO2: 98% RA  0755-0900  Patient ambulated independently  Using a rolling walker. Gait steady tolerated well. Patient assisted back to bed with call light in reach. Reviewed heart healthy diabetic diet information with the patient. Robert Peterson admits that he does not check his CBG's at home as much as he should and that his CBG's are running higher than they should. Could not find stent card inpatient's room.Patient's RN notified. Encouraged patient to check his blood sugars at home more often. Patient would benefit from dietary counseling although not sure how complaint patient will be. Reviewed exercise instructions with patient. Patient reports he does some walking at home but not a lot. Reviewed use of sublingual nitroglycerin and when to call 911. Will refer to cardiac rehab in [place]. Robert Peterson says he is not sure he can participate due to cost.  Harrell Gave RN BSN

## 2019-04-30 NOTE — Discharge Summary (Addendum)
Discharge Summary    Patient ID: Robert Peterson MRN: [id number]; DOB: 05/01/1945  Admit date: 04/29/2019 Discharge date: 04/30/2019  Primary Care Provider: Algis Greenhouse, MD  Primary Cardiologist: Robert Lindau, MD  Primary Electrophysiologist:  None   Discharge Diagnoses    Principal Problem:   Effort angina [facility]) Active Problems:   CAD (coronary artery disease)   Mixed hyperlipidemia   Type 2 diabetes mellitus, with long-term current use of insulin (HCC)   DOE (dyspnea on exertion)   CKD (chronic kidney disease), stage II    Diagnostic Studies/Procedures    Cardiac Cath 04/29/19 Conclusion    Dist RCA lesion is 50% stenosed.  Ost LAD to Prox LAD lesion is 60% stenosed.  Mid LAD lesion is 80% stenosed.  Post intervention, there is a 0% residual stenosis.  A drug-eluting stent was successfully placed using a STENT RESOLUTE ONYX 2.75X30.  Ost Cx to Prox Cx lesion is 50% stenosed.   1.  Significant one-vessel coronary artery disease involving the mid LAD with moderate stenosis affecting the ostial LAD (not significant by fractional flow reserve-DFR of 0.94), moderate ostial left circumflex stenosis and moderate distal RCA stenosis. 2.  Mildly elevated left ventricular end-diastolic pressure at 13 mmHg. 3.  Successful angioplasty and drug-eluting stent placement to the mid LAD.  Very difficult procedure due to severe tortuosity of the innominate artery leading to poor support from the guide catheter.  Recommendations: Dual antiplatelet therapy for at least 6 months. Aggressive treatment of residual coronary artery disease. Avoid catheterization via right radial artery in the future due to innominate artery tortuosity. If ostial LAD becomes significant in the future, it would be difficult to treat percutaneously as it will require pinching the ramus and the left circumflex. Given that we reached our contrast limit, I am going to hydrate him overnight to  decrease the chance of contrast-induced nephropathy.       _____________   History of Present Illness     Robert Peterson is a 74 y.o. male with DM, essential HTN, dyslipidemia, lower extremity edema, COPD, GERD, hypothyroidism, mild intermittent asthma, OSA, RLS, obesity, CKD stage II, ascending aortic aneurysm by echo 04/2019 who presented to [facility] for planned cath due to recent abnormal coronary CT findings.  Hospital Course     He was recently seen by Dr. Geraldo Peterson and reported dyspnea on exertion that had been getting worse recently associated with chest tightness. Coronary CT was pursued which was suggestive of occlusive coronary disease. 2D echo 04/26/19 showed EF 60-65%, grade 2 DD, aneurysm of ascending aorta 56mm. Cardiac catheterization was recommended.  He was brought in for this procdeure 4/16/2021with findings above, significant one-vessel coronary artery disease involving the mid LAD with moderate stenosis affecting the ostial LAD (not significant by fractional flow reserve-DFR of 0.94), moderate ostial left circumflex stenosis and moderate distal RCA stenosis. He had mildly elevated left ventricular end-diastolic pressure at 13 mmHg. He underwent successful angioplasty and drug-eluting stent placement to the mid LAD. It was a very difficult procedure due to severe tortuosity of the innominate artery leading to poor support from the guide catheter. Would avoid catheterization via right radial artery in the future due to innominate artery tortuosity. It was recommended to treat residual CAD medically. If ostial LAD became significant in the future, it would be difficult to treat percutaneously as it would require pinching the ramus and the left circumflex. He was admitted overnight for post-cath hydration. Post-cath Cr was stable. He  did develop elevated blood pressures and therefore oral hydralazine was started acutely. His BP was 115/87 this morning, then 157/82 when  cardiac rehab evaluated him. Given his residual CAD we will instead change hydralazine to amlodipine 5mg  daily. Dr. Audie Box. has seen and examined the patient today and feels he is stable for discharge. I have sent a message to our office's scheduling team requesting a follow-up appointment, and our office will call the patient with this information. He will also need follow-up of ascending aorta findings from recent echo as OP. He was also advised to f/u PCP for diabetes management.  I sent in the patient's prescriptions to his usual pharmacy but they close at 1pm. His daughter confirmed she will pick them up today before she comes to pick him up.   Did the patient have an acute coronary syndrome (MI, NSTEMI, STEMI, etc) this admission?:  No                               Did the patient have a percutaneous coronary intervention (stent / angioplasty)?:  Yes.     Cath/PCI Registry Performance & Quality Measures: 1. Aspirin prescribed? - Yes 2. ADP Receptor Inhibitor (Plavix/Clopidogrel, Brilinta/Ticagrelor or Effient/Prasugrel) prescribed (includes medically managed patients)? - Yes 3. High Intensity Statin (Lipitor 40-80mg  or Crestor 20-40mg ) prescribed? - Yes 4. For EF <40%, was ACEI/ARB prescribed? - Not Applicable (EF >/= [id number]) 5. For EF <40%, Aldosterone Antagonist (Spironolactone or Eplerenone) prescribed? - Not Applicable (EF >/= [id number]) but is on anyway 6. Cardiac Rehab Phase II ordered (Included Medically managed Patients)? - Yes   _____________  Discharge Vitals Vital Signs. BP 115/87 (BP Location: Left Arm)   Pulse (!) 57   Temp (!) 97.5 F (36.4 C) (Oral)   Resp 16   Ht 5\' 7"  (1.702 m)   Wt 105.2 kg   SpO2 96%   BMI 36.32 kg/m   Examined by Dr. Marisue Peterson General: Well developed, well nourished M, in no acute distress. Head: Normocephalic, atraumatic, sclera non-icteric, no xanthomas, nares are without discharge. Neck: Negative for carotid bruits. JVP not elevated. Lungs: Clear  bilaterally to auscultation without wheezes, rales, or rhonchi. Breathing is unlabored. Heart: RRR S1 S2 without murmurs, rubs, or gallops.  Abdomen: Soft, non-tender, non-distended with normoactive bowel sounds. No rebound/guarding. Extremities: No clubbing or cyanosis. No edema. Distal pedal pulses are 2+ and equal bilaterally. Right radial cath site without hematoma or ecchymosis; good pulse. Neuro: Alert and oriented X 3. Moves all extremities spontaneously. Psych:  Responds to questions appropriately with a normal affect.   Labs & Radiologic Studies    CBC Recent Labs    04/30/19 0026  WBC 10.6*  HGB 13.9  HCT 42.4  MCV 88.0  PLT 99991111   Basic Metabolic Panel Recent Labs    04/30/19 0026  NA 133*  K 4.0  CL 99  CO2 26  GLUCOSE 293*  BUN 23  CREATININE 1.16  CALCIUM 8.7*   Hemoglobin A1C Recent Labs    04/29/19 2319  HGBA1C 12.3*   _____________  CARDIAC CATHETERIZATION  Addendum Date: 04/29/2019    Dist RCA lesion is 50% stenosed.  Ost LAD to Prox LAD lesion is 60% stenosed.  Mid LAD lesion is 80% stenosed.  Post intervention, there is a 0% residual stenosis.  A drug-eluting stent was successfully placed using a STENT RESOLUTE ONYX 2.75X30.  Ost Cx to Prox Cx lesion is 50% stenosed.  1.  Significant one-vessel coronary artery disease involving the mid LAD with moderate stenosis affecting the ostial LAD (not significant by fractional flow reserve-DFR of 0.94), moderate ostial left circumflex stenosis and moderate distal RCA stenosis. 2.  Mildly elevated left ventricular end-diastolic pressure at 13 mmHg. 3.  Successful angioplasty and drug-eluting stent placement to the mid LAD.  Very difficult procedure due to severe tortuosity of the innominate artery leading to poor support from the guide catheter. Recommendations: Dual antiplatelet therapy for at least 6 months. Aggressive treatment of residual coronary artery disease. Avoid catheterization via right radial  artery in the future due to innominate artery tortuosity. If ostial LAD becomes significant in the future, it would be difficult to treat percutaneously as it will require pinching the ramus and the left circumflex. Given that we reached our contrast limit, I am going to hydrate him overnight to decrease the chance of contrast-induced nephropathy.   Result Date: 04/29/2019  Dist RCA lesion is 50% stenosed.  Ost LAD to Prox LAD lesion is 60% stenosed.  Mid LAD lesion is 80% stenosed.  Post intervention, there is a 0% residual stenosis.  A drug-eluting stent was successfully placed using a STENT RESOLUTE ONYX 2.75X30.  Ost Cx to Prox Cx lesion is 50% stenosed.  1.  Significant one-vessel coronary artery disease involving the mid LAD with moderate stenosis affecting the ostial LAD (not significant by fractional flow reserve-DFR of 0.94), moderate ostial left circumflex stenosis and moderate distal RCA stenosis. 2.  Mildly elevated left ventricular end-diastolic pressure at 13 mmHg. 3.  Successful angioplasty and drug-eluting stent placement to the mid LAD.  Very difficult procedure due to severe tortuosity of the innominate artery leading to poor support from the guide catheter. Recommendations: Dual antiplatelet therapy for at least 6 months. Aggressive treatment of residual coronary artery disease. Avoid catheterization via right radial artery in the future due to innominate artery tortuosity. If ostial LAD becomes significant in the future, it would be difficult to treat percutaneously as it will require pinching the ramus and the left circumflex.   CT CORONARY MORPH W/CTA COR W/SCORE W/CA W/CM &/OR WO/CM  Addendum Date: 04/18/2019   ADDENDUM REPORT: 04/18/2019 17:10 CLINICAL DATA:  Chest pain EXAM: Cardiac CTA MEDICATIONS: Sub lingual nitro. 4 mg and lopressor mg TECHNIQUE: The patient was scanned on a [employer] 192 scanner. Gantry rotation speed was 250 msecs. Collimation was. 6 mm . A 120 kV  prospective scan was triggered in the ascending thoracic aorta at 140 HU's with full mA between 30-70% of the R-R interval . Average HR during the scan was bpm. The 3D data set was interpreted on a dedicated work station using MPR, MIP and VRT modes. A total of 80 cc of contrast was used. FINDINGS: Non-cardiac: See separate report from [facility]. No significant findings on limited lung and soft tissue windows. Calcium score: 3 vessel coronary calcium noted Coronary Arteries: Right dominant with no anomalies LM: Normal LAD: 50-69% proximal and mid vessel calcific stenosis. 1-24% calcific distal stenosis D1: 50-69% calcific stenosis IM: Large vessel 50-69% proximal stenosis Circumflex: 1-24% proximal calcific stenosis OM1: Normal AV Groove: Normal RCA: 25-49% % calcified ostial stenosis 1-24% calcified stenosis mid and distal PDA: Normal PLA: Normal IMPRESSION: 1. Calcium score 808 which is 55 th percentile for age and sex 2.  Moderate ascending aortic root dilatation 4.3 cm 3. CAD RADS 3 possibly obstructive CAD most concerning in the LAD/IM/D1 branches 4.  Study sent for [facility] CT Collier [place]ina  Nishan Electronically Signed   By: Jenkins Rouge M.D.   On: 04/18/2019 17:10   Result Date: 04/18/2019 EXAM: OVER-READ INTERPRETATION  CT CHEST The following report is an over-read performed by radiologist Dr. Rolm Baptise of [facility], [place] on 04/18/2019. This over-read does not include interpretation of cardiac or coronary anatomy or pathology. The coronary CTA interpretation by the cardiologist is attached. COMPARISON:  03/12/2018 FINDINGS: Vascular: Heart is normal size. Ascending thoracic aorta upper limits normal in size at 3.9 cm. Mediastinum/Nodes: No adenopathy in the visualized lower mediastinum or hila. Lungs/Pleura: No confluent opacities or effusions. Upper Abdomen: Imaging into the upper abdomen shows no acute findings. Musculoskeletal: Bilateral gynecomastia.  No acute bony abnormality. IMPRESSION: No  acute extra cardiac abnormality. Bilateral gynecomastia. Electronically Signed: By: Rolm Baptise M.D. On: 04/18/2019 15:44   CT CORONARY FRACTIONAL FLOW RESERVE FLUID ANALYSIS  Result Date: 04/19/2019 CLINICAL DATA:  CAD EXAM: FFR CT TECHNIQUE: The best systolic and diastolic phases of the patients gated cardiac CTA sent for HeartFlow for hemodynamic analysis FINDINGS: FFR CT positive in proximal LAD 0.75 FFR CT positive in D1 0.73 FFR CT borderline in IM 0.89 FFR CT positive  in AV circumflex 0.77 FFR CT positive in very distal PDA/PLB 0.79 IMPRESSION: FFR CT positive see above most concerning in proximal mid and distal LAD Patient should be referred for heart catheterization Jenkins Rouge Electronically Signed   By: Jenkins Rouge M.D.   On: 04/19/2019 10:54   ECHOCARDIOGRAM COMPLETE  Result Date: 04/26/2019    ECHOCARDIOGRAM REPORT   Patient Name:   Robert Peterson Date of Exam: 04/26/2019 Medical Rec #:  [id number]            Height:       67.0 in Accession #:    [id number]           Weight:       242.0 lb Date of Birth:  1945-05-16            BSA:          2.193 m Patient Age:    1 years             BP:           164/88 mmHg Patient Gender: M                    HR:           65 bpm. Exam Location:  [place] Procedure: 2D Echo Indications:    Murmur 785.2 / R01.1  History:        Patient has no prior history of Echocardiogram examinations.                 CAD, Signs/Symptoms:Chest Pain, Dyspnea and Morbid obesity; Risk                 Factors:Diabetes and Dyslipidemia.  Sonographer:    Luane School Referring Phys: [place]  1. Left ventricular ejection fraction, by estimation, is 60 to 65%. The left ventricle has normal function. The left ventricle has no regional wall motion abnormalities. There is severe concentric left ventricular hypertrophy. Left ventricular diastolic  parameters are consistent with Grade II diastolic dysfunction (pseudonormalization).  2. Right ventricular  systolic function is normal. The right ventricular size is normal.  3. The mitral valve is normal in structure. Trivial mitral valve regurgitation. No evidence of mitral stenosis.  4. The aortic valve is mildly thickened  and mildy calcified. Aortic valve regurgitation is trivial. Mild aortic valve sclerosis is present, with no evidence of aortic valve stenosis.  5. Aneurysm of the ascending aorta, measuring 41 mm.  6. The inferior vena cava is normal in size with greater than 50% respiratory variability, suggesting right atrial pressure of 3 mmHg. Comparison(s): No prior Echocardiogram. FINDINGS  Left Ventricle: Left ventricular ejection fraction, by estimation, is 60 to 65%. The left ventricle has normal function. The left ventricle has no regional wall motion abnormalities. The left ventricular internal cavity size was normal in size. There is  severe concentric left ventricular hypertrophy. Left ventricular diastolic parameters are consistent with Grade II diastolic dysfunction (pseudonormalization). Right Ventricle: The right ventricular size is normal. No increase in right ventricular wall thickness. Right ventricular systolic function is normal. Left Atrium: Left atrial size was normal in size. Right Atrium: Right atrial size was normal in size. Pericardium: There is no evidence of pericardial effusion. Presence of pericardial fat pad. Mitral Valve: The mitral valve is normal in structure. Normal mobility of the mitral valve leaflets. Trivial mitral valve regurgitation. No evidence of mitral valve stenosis. Tricuspid Valve: The tricuspid valve is normal in structure. Tricuspid valve regurgitation is not demonstrated. No evidence of tricuspid stenosis. Aortic Valve: The aortic valve is abnormal. . There is mild thickening and mild calcification of the aortic valve. Aortic valve regurgitation is trivial. Mild aortic valve sclerosis is present, with no evidence of aortic valve stenosis. There is mild thickening  of the aortic valve. There is mild calcification of the aortic valve. Pulmonic Valve: The pulmonic valve was not well visualized. Pulmonic valve regurgitation is trivial. No evidence of pulmonic stenosis. Aorta: Aortic dilatation noted. There is an aneurysm involving the ascending aorta. The aneurysm measures 41 mm. Venous: The inferior vena cava is normal in size with greater than 50% respiratory variability, suggesting right atrial pressure of 3 mmHg. IAS/Shunts: No atrial level shunt detected by color flow Doppler.  LEFT VENTRICLE PLAX 2D LVIDd:         4.30 cm  Diastology LVIDs:         2.40 cm  LV e' lateral:   5.66 cm/s LV PW:         1.65 cm  LV E/e' lateral: 13.7 LV IVS:        1.65 cm  LV e' medial:    4.90 cm/s LVOT diam:     2.00 cm  LV E/e' medial:  15.8 LV SV:         71 LV SV Index:   32 LVOT Area:     3.14 cm  RIGHT VENTRICLE             IVC RV S prime:     10.90 cm/s  IVC diam: 1.10 cm TAPSE (M-mode): 2.6 cm LEFT ATRIUM             Index       RIGHT ATRIUM           Index LA diam:        3.85 cm 1.76 cm/m  RA Area:     16.90 cm LA Vol (A2C):   71.1 ml 32.42 ml/m RA Volume:   41.90 ml  19.11 ml/m LA Vol (A4C):   63.4 ml 28.91 ml/m LA Biplane Vol: 70.9 ml 32.33 ml/m  AORTIC VALVE LVOT Vmax:   105.00 cm/s LVOT Vmean:  72.900 cm/s LVOT VTI:    0.225 m  AORTA Ao Root diam:  3.45 cm Ao Asc diam:  4.10 cm MITRAL VALVE MV Area (PHT): 2.76 cm     SHUNTS MV Decel Time: 275 msec     Systemic VTI:  0.22 m MV E velocity: 77.60 cm/s   Systemic Diam: 2.00 cm MV A velocity: 112.00 cm/s MV E/A ratio:  0.69 Kardie Tobb DO Electronically signed by Berniece Salines DO Signature Date/Time: 04/26/2019/4:49:51 PM    Final    Disposition   Pt is being discharged home today in good condition.  Follow-up Plans & Appointments    Follow-up Information    Revankar, Reita Cliche, MD Follow up.   Specialty: Cardiology Why: Our office will call you for a follow-up appointment that is sooner than your June appointment.  Please call the office if you have not heard from [place] within 3 days. Contact information: [address] [place] [address] [phone]        Robert Greenhouse, MD Follow up.   Specialty: Family Medicine Why: You should see your primary care provider to come up with a plan for better control of your diabetes. Contact information: [place] [address] [phone]          Discharge Instructions    Amb Referral to Cardiac Rehabilitation   Complete by: As directed    Diagnosis: Coronary Stents   After initial evaluation and assessments completed: Virtual Based Care may be provided alone or in conjunction with Phase 2 Cardiac Rehab based on patient barriers.: Yes   Diet - low sodium heart healthy   Complete by: As directed    Discharge instructions   Complete by: As directed    You should not take any Metformin for at least 48 hours after your heart catheterization. This allows time for your body to get rid of the contrast dye from your heart cath. You can restart this the morning of 05/02/19.  Some studies suggest Prilosec/Omeprazole interacts with Plavix. We changed your Prilosec/Omeprazole to the equivalent dose of Protonix/Pantoprazole for less chance of interaction.   You were started on a new medicine called amlodipine for your blood pressure. Please monitor your blood pressure occasionally at home. Call your doctor if you tend to get readings of greater than 130 on the top number or 80 on the bottom number. You should also call if you develop any signs or symptoms of low blood pressure as well.  You were started on a new medicine called clopidogrel (Plavix) which is a blood thinner you'll take in addition to aspirin. If you notice any bleeding such as blood in stool, black tarry stools, blood in urine, nosebleeds or any other unusual bleeding, call your doctor immediately. It is not normal to have this kind of bleeding while on a blood thinner and usually  indicates there is an underlying problem with one of your body systems that needs to be checked out.   Increase activity slowly   Complete by: As directed    No driving for 2 days. No lifting over 5 lbs for 1 week. No sexual activity for 1 week. Keep procedure site clean & dry. If you notice increased pain, swelling, bleeding or pus, call/return!  You may shower, but no soaking baths/hot tubs/pools for 1 week.      Discharge Medications   Allergies as of 04/30/2019   No Known Allergies     Medication List    STOP taking these medications   omeprazole 20 MG capsule Commonly known as: PRILOSEC Replaced by: pantoprazole  40 MG tablet     TAKE these medications   amLODipine 5 MG tablet Commonly known as: NORVASC Take 1 tablet (5 mg total) by mouth daily. Notes to patient: You were given this medication today.   aspirin EC 81 MG tablet Take 1 tablet (81 mg total) by mouth daily.   atorvastatin 80 MG tablet Commonly known as: LIPITOR Take 80 mg by mouth daily.   Breo Ellipta 100-25 MCG/INH Aepb Generic drug: fluticasone furoate-vilanterol Inhale 1 puff into the lungs daily.   clopidogrel 75 MG tablet Commonly known as: PLAVIX Take 1 tablet (75 mg total) by mouth daily.   HYDROcodone-acetaminophen 5-325 MG tablet Commonly known as: NORCO/VICODIN Take 1 tablet by mouth as needed for severe pain.   irbesartan 300 MG tablet Commonly known as: AVAPRO Take 300 mg by mouth every morning.   latanoprost 0.005 % ophthalmic solution Commonly known as: XALATAN Place 1 drop into both eyes at bedtime.   levothyroxine 75 MCG tablet Commonly known as: SYNTHROID Take 75 mcg by mouth daily.   metFORMIN 1000 MG tablet Commonly known as: GLUCOPHAGE Take 1,000 mg by mouth 2 (two) times daily with a meal. Notes to patient: You should not take any Metformin for at least 48 hours after your heart catheterization. You can restart this the morning of 05/02/19.   metoprolol succinate 50 MG  24 hr tablet Commonly known as: TOPROL-XL Take 50 mg by mouth daily.   nitroGLYCERIN 0.4 MG SL tablet Commonly known as: NITROSTAT Place 1 tablet (0.4 mg total) under the tongue every 5 (five) minutes as needed for chest pain.   pantoprazole 40 MG tablet Commonly known as: PROTONIX Take 1 tablet (40 mg total) by mouth daily. Start taking on: May 01, 2019 Replaces: omeprazole 20 MG capsule   spironolactone 25 MG tablet Commonly known as: ALDACTONE Take 25 mg by mouth in the morning.   Tyler Aas FlexTouch 100 UNIT/ML FlexTouch Pen Generic drug: insulin degludec Inject 45 Units into the skin at bedtime.          Outstanding Labs/Studies   N/A  Duration of Discharge Encounter   Greater than 30 minutes including physician time.  Signed, Charlie Pitter, PA-C 04/30/2019, 11:32 AM

## 2019-04-30 NOTE — Progress Notes (Signed)
Patient CPAP at bedside and setup. Placed on patient and tolerating well. No issues at this time.

## 2019-05-02 ENCOUNTER — Telehealth (HOSPITAL_COMMUNITY): Payer: Self-pay

## 2019-05-02 NOTE — Telephone Encounter (Signed)
Faxed CR referral to [place] [facility].

## 2019-05-24 ENCOUNTER — Ambulatory Visit: Payer: Medicare Other | Admitting: Cardiology

## 2019-05-27 ENCOUNTER — Other Ambulatory Visit: Payer: Self-pay

## 2019-05-30 ENCOUNTER — Encounter: Payer: Self-pay | Admitting: Cardiology

## 2019-05-30 ENCOUNTER — Other Ambulatory Visit: Payer: Self-pay

## 2019-05-30 ENCOUNTER — Ambulatory Visit (INDEPENDENT_AMBULATORY_CARE_PROVIDER_SITE_OTHER): Payer: Medicare Other | Admitting: Cardiology

## 2019-05-30 VITALS — BP 124/74 | HR 74 | Ht 67.0 in | Wt 243.0 lb

## 2019-05-30 DIAGNOSIS — N289 Disorder of kidney and ureter, unspecified: Secondary | ICD-10-CM

## 2019-05-30 DIAGNOSIS — I25118 Atherosclerotic heart disease of native coronary artery with other forms of angina pectoris: Secondary | ICD-10-CM | POA: Diagnosis not present

## 2019-05-30 DIAGNOSIS — I10 Essential (primary) hypertension: Secondary | ICD-10-CM | POA: Diagnosis not present

## 2019-05-30 DIAGNOSIS — E782 Mixed hyperlipidemia: Secondary | ICD-10-CM

## 2019-05-30 NOTE — Patient Instructions (Signed)
Medication Instructions:  No medication changes. *If you need a refill on your cardiac medications before your next appointment, please call your pharmacy*   Lab Work: Your physician recommends that you return for lab work in: next few days. You need to have labs done when you are fasting.  You can come Monday through Friday 8:30 am to 12:00 pm and 1:15 to 4:30. You do not need to make an appointment as the order has already been placed. The labs you are going to have done are BMET, TSH, LFT and Lipids.   If you have labs (blood work) drawn today and your tests are completely normal, you will receive your results only by: Marland Kitchen MyChart Message (if you have MyChart) OR . A paper copy in the mail If you have any lab test that is abnormal or we need to change your treatment, we will call you to review the results.   Testing/Procedures: None ordered   Follow-Up: At [facility], you and your health needs are our priority.  As part of our continuing mission to provide you with exceptional heart care, we have created designated Provider Care Teams.  These Care Teams include your primary Cardiologist (physician) and Advanced Practice Providers (APPs -  Physician Assistants and Nurse Practitioners) who all work together to provide you with the care you need, when you need it.  We recommend signing up for the patient portal called "MyChart".  Sign up information is provided on this After Visit Summary.  MyChart is used to connect with patients for Virtual Visits (Telemedicine).  Patients are able to view lab/test results, encounter notes, upcoming appointments, etc.  Non-urgent messages can be sent to your provider as well.   To learn more about what you can do with MyChart, go to [email or web address].    Your next appointment:   6 day(s)  The format for your next appointment:   In Person  Provider:   Jyl Heinz, MD   Other Instructions NA

## 2019-05-30 NOTE — Progress Notes (Signed)
Cardiology Office Note:    Date:  05/30/2019   ID:  [name], DOB [birth year], MRN [id number]  PCP:  Algis Greenhouse, MD  Cardiologist:  Jenean Lindau, MD   Referring MD: Algis Greenhouse, MD    ASSESSMENT:    1. Coronary artery disease of native artery of native heart with stable angina pectoris ([place])   2. Essential hypertension   3. Mixed hyperlipidemia   4. Morbid obesity ([place])    PLAN:    In order of problems listed above:  1. Coronary artery disease: Secondary prevention stressed with the patient.  Importance of compliance with diet and medication stressed and he vocalized understanding. 2. Essential hypertension: Blood pressure stable 3. Mixed dyslipidemia and diabetes mellitus: Diet was emphasized.  I told him to be back in the next few days for blood work and I would really like to be medically aggressive with his lipid control. 4. Morbid obesity: I discussed risks of obesity and weight reduction was stressed and he promises to do better.  Diet and the importance of regular exercise stressed he has orthopedic issues limiting ambulation according to the patient. 5. Patient will be seen in follow-up appointment in 6 months or earlier if the patient has any concerns 6.    Medication Adjustments/Labs and Tests Ordered: Current medicines are reviewed at length with the patient today.  Concerns regarding medicines are outlined above.  No orders of the defined types were placed in this encounter.  No orders of the defined types were placed in this encounter.    Chief Complaint  Patient presents with  . Follow-up  . Edema     History of Present Illness:    Robert Peterson is a 74 y.o. male.  Patient has past medical history of coronary artery disease post intervention, essential hypertension dyslipidemia morbid obesity.  He denies any problems at this time and takes care of activities of daily living.  No chest pain orthopnea or PND.  He leads a  sedentary lifestyle.  At the time of my evaluation, the patient is alert awake oriented and in no distress.  Past Medical History:  Diagnosis Date  . Acute kidney injury ([place]) 04/15/2018   2020: dehydration related to septic bursitis, hosp; minor changes  . Angina pectoris ([place]) 04/25/2019  . Aortic valve sclerosis 04/02/2018  . Ascending aortic aneurysm ([place])   . Bilateral lower extremity edema 04/16/2018  . CAD (coronary artery disease)    a. Effort angina -> cath 04/2015 s/p drug-eluting stent placement to the mid LAD, residual CAD managed medically, normal LVEF  . Cellulitis of right lower extremity 03/17/2018  . Chronic lumbar pain 08/01/2010  . CKD (chronic kidney disease), stage II   . COPD (chronic obstructive pulmonary disease) ([place])   . Coronary artery disease of native artery of native heart with stable angina pectoris ([place]) 04/25/2019   Formatting of this note might be different from the original. Cardiac Cath 04/29/19 Conclusion  Dist RCA lesion is 50% stenosed.  Ost LAD to Prox LAD lesion is 60% stenosed.  Mid LAD lesion is 80% stenosed.  Post intervention, there is a 0% residual stenosis.  A drug-eluting stent was successfully placed using a STENT RESOLUTE ONYX 2.75X30.  Ost Cx to Prox Cx lesion is 50% stenosed.  . Diabetes ([place])   . DOE (dyspnea on exertion) 03/17/2019  . Effort angina ([place]) 04/29/2019  . Encounter for laboratory testing for COVID-19 virus 05/20/2018   Formatting of this note  might be different from the original. 2020: DCDMV  Formatting of this note might be different from the original. 2020  . Erectile dysfunction 02/16/2016  . Essential hypertension 05/08/2015   1991: dx  . Gastroesophageal reflux disease without esophagitis 05/08/2015  . Glaucoma 05/08/2015  . Hyperlipidemia   . Hypertension   . Hypothyroidism 05/08/2015  . Mild intermittent asthma without complication [id number]  . Mixed hyperlipidemia 05/08/2015  . Morbid obesity ([place]) 03/17/2019  . Murmur 02/24/2018   . OSA on CPAP 11/20/2015  . Renal insufficiency 04/25/2019  . Restless leg syndrome 05/08/2015  . Septic prepatellar bursitis of right knee 04/15/2018   2020: hosp, MRSA, I/D  . Swelling 02/24/2018  . Tachycardia 04/02/2018  . Type 2 diabetes mellitus, with long-term current use of insulin ([place]) 05/08/2015   2002: dx 6.1 XXXX: CKD3    Past Surgical History:  Procedure Laterality Date  . CORONARY STENT INTERVENTION N/A 04/29/2019   Procedure: CORONARY STENT INTERVENTION;  Surgeon: Wellington Hampshire, MD;  Location: [place] [facility];  Service: Cardiovascular;  Laterality: N/A;  . INTRAVASCULAR PRESSURE WIRE/FFR STUDY N/A 04/29/2019   Procedure: INTRAVASCULAR PRESSURE WIRE/FFR STUDY;  Surgeon: Wellington Hampshire, MD;  Location: [place] [facility];  Service: Cardiovascular;  Laterality: N/A;  . LEFT HEART CATH AND CORONARY ANGIOGRAPHY N/A 04/29/2019   Procedure: LEFT HEART CATH AND CORONARY ANGIOGRAPHY;  Surgeon: Wellington Hampshire, MD;  Location: [place] [facility];  Service: Cardiovascular;  Laterality: N/A;    Current Medications: Current Meds  Medication Sig  . amLODipine (NORVASC) 5 MG tablet Take 1 tablet (5 mg total) by mouth daily.  Marland Kitchen aspirin EC 81 MG tablet Take 1 tablet (81 mg total) by mouth daily.  Marland Kitchen atorvastatin (LIPITOR) 80 MG tablet Take 80 mg by mouth daily.   . clopidogrel (PLAVIX) 75 MG tablet Take 1 tablet (75 mg total) by mouth daily.  . fluticasone furoate-vilanterol (BREO ELLIPTA) 100-25 MCG/INH AEPB Inhale 1 puff into the lungs daily.  Marland Kitchen HYDROcodone-acetaminophen (NORCO/VICODIN) 5-325 MG tablet Take 1 tablet by mouth as needed for severe pain.   Marland Kitchen insulin degludec (TRESIBA FLEXTOUCH) 100 UNIT/ML FlexTouch Pen Inject 55 Units into the skin daily.  . irbesartan (AVAPRO) 300 MG tablet Take 300 mg by mouth every morning.  . latanoprost (XALATAN) 0.005 % ophthalmic solution Place 1 drop into both eyes at bedtime.  Marland Kitchen levothyroxine (SYNTHROID) 75 MCG tablet Take 75 mcg by mouth  daily.   . metFORMIN (GLUCOPHAGE) 1000 MG tablet Take 1,000 mg by mouth 2 (two) times daily with a meal.   . metoprolol succinate (TOPROL-XL) 50 MG 24 hr tablet Take 50 mg by mouth daily.   . nitroGLYCERIN (NITROSTAT) 0.4 MG SL tablet Place 1 tablet (0.4 mg total) under the tongue every 5 (five) minutes as needed for chest pain.  . pantoprazole (PROTONIX) 40 MG tablet Take 1 tablet (40 mg total) by mouth daily.  Marland Kitchen spironolactone (ALDACTONE) 25 MG tablet Take 25 mg by mouth in the morning.      Allergies:   Patient has no known allergies.   Social History   Socioeconomic History  . Marital status: Married    Spouse name: Not on file  . Number of children: Not on file  . Years of education: Not on file  . Highest education level: Not on file  Occupational History  . Not on file  Tobacco Use  . Smoking status: Never Smoker  . Smokeless tobacco: Never Used  Substance and  Sexual Activity  . Alcohol use: Never  . Drug use: Never  . Sexual activity: Not on file  Other Topics Concern  . Not on file  Social History Narrative  . Not on file   Social Determinants of Health   Financial Resource Strain:   . Difficulty of Paying Living Expenses:   Food Insecurity:   . Worried About Charity fundraiser in the Last Year:   . Arboriculturist in the Last Year:   Transportation Needs:   . Film/video editor (Medical):   Marland Kitchen Lack of Transportation (Non-Medical):   Physical Activity:   . Days of Exercise per Week:   . Minutes of Exercise per Session:   Stress:   . Feeling of Stress :   Social Connections:   . Frequency of Communication with Friends and Family:   . Frequency of Social Gatherings with Friends and Family:   . Attends Religious Services:   . Active Member of Clubs or Organizations:   . Attends Archivist Meetings:   Marland Kitchen Marital Status:      Family History: The patient's family history is not on file.  ROS:   Please see the history of present illness.      All other systems reviewed and are negative.  EKGs/Labs/Other Studies Reviewed:    The following studies were reviewed today: Panel Physicians Referring Physician Case Authorizing Physician  Wellington Hampshire, MD (Primary)    Procedures  CORONARY STENT INTERVENTION  INTRAVASCULAR PRESSURE WIRE/FFR STUDY  LEFT HEART CATH AND CORONARY ANGIOGRAPHY  Conclusion    Dist RCA lesion is 50% stenosed.  Ost LAD to Prox LAD lesion is 60% stenosed.  Mid LAD lesion is 80% stenosed.  Post intervention, there is a 0% residual stenosis.  A drug-eluting stent was successfully placed using a STENT RESOLUTE ONYX 2.75X30.  Ost Cx to Prox Cx lesion is 50% stenosed.   1.  Significant one-vessel coronary artery disease involving the mid LAD with moderate stenosis affecting the ostial LAD (not significant by fractional flow reserve-DFR of 0.94), moderate ostial left circumflex stenosis and moderate distal RCA stenosis. 2.  Mildly elevated left ventricular end-diastolic pressure at 13 mmHg. 3.  Successful angioplasty and drug-eluting stent placement to the mid LAD.  Very difficult procedure due to severe tortuosity of the innominate artery leading to poor support from the guide catheter.  Recommendations: Dual antiplatelet therapy for at least 6 months. Aggressive treatment of residual coronary artery disease. Avoid catheterization via right radial artery in the future due to innominate artery tortuosity. If ostial LAD becomes significant in the future, it would be difficult to treat percutaneously as it will require pinching the ramus and the left circumflex. Given that we reached our contrast limit, I am going to hydrate him overnight to decrease the chance of contrast-induced nephropathy.       Recent Labs: 04/30/2019: BUN 23; Creatinine, Ser 1.16; Hemoglobin 13.9; Platelets 205; Potassium 4.0; Sodium 133  Recent Lipid Panel No results found for: CHOL, TRIG, HDL, CHOLHDL, VLDL, LDLCALC,  LDLDIRECT  Physical Exam:    VS:  BP 124/74 (BP Location: Left Arm, Patient Position: Sitting, Cuff Size: Normal)   Pulse 74   Ht 5\' 7"  (1.702 m)   Wt 243 lb (110.2 kg)   SpO2 94%   BMI 38.06 kg/m     Wt Readings from Last 3 Encounters:  05/30/19 243 lb (110.2 kg)  04/30/19 231 lb 14.4 oz (105.2 kg)  04/25/19 242  lb (109.8 kg)     GEN: Patient is in no acute distress HEENT: Normal NECK: No JVD; No carotid bruits LYMPHATICS: No lymphadenopathy CARDIAC: Hear sounds regular, 2/6 systolic murmur at the apex. RESPIRATORY:  Clear to auscultation without rales, wheezing or rhonchi  ABDOMEN: Soft, non-tender, non-distended MUSCULOSKELETAL:  No edema; No deformity  SKIN: Warm and dry NEUROLOGIC:  Alert and oriented x 3 PSYCHIATRIC:  Normal affect   Signed, Jenean Lindau, MD  05/30/2019 2:43 PM    [facility]

## 2019-05-31 LAB — BASIC METABOLIC PANEL
BUN/Creatinine Ratio: 22 (ref 10–24)
BUN: 29 mg/dL — ABNORMAL HIGH (ref 8–27)
CO2: 25 mmol/L (ref 20–29)
Calcium: 9.2 mg/dL (ref 8.6–10.2)
Chloride: 97 mmol/L (ref 96–106)
Creatinine, Ser: 1.33 mg/dL — ABNORMAL HIGH (ref 0.76–1.27)
GFR calc Af Amer: 60 mL/min/{1.73_m2} (ref >59)
GFR calc non Af Amer: 52 mL/min/{1.73_m2} — ABNORMAL LOW (ref >59)
Glucose: 230 mg/dL — ABNORMAL HIGH (ref 65–99)
Potassium: 4.4 mmol/L (ref 3.5–5.2)
Sodium: 135 mmol/L (ref 134–144)

## 2019-05-31 LAB — LIPID PANEL
Chol/HDL Ratio: 3.5 ratio (ref 0.0–5.0)
Cholesterol, Total: 112 mg/dL (ref 100–199)
HDL: 32 mg/dL — ABNORMAL LOW (ref >39)
LDL Chol Calc (NIH): 59 mg/dL (ref 0–99)
Triglycerides: 112 mg/dL (ref 0–149)
VLDL Cholesterol Cal: 21 mg/dL (ref 5–40)

## 2019-05-31 LAB — HEPATIC FUNCTION PANEL
ALT: 20 IU/L (ref 0–44)
AST: 19 IU/L (ref 0–40)
Albumin: 4.1 g/dL (ref 3.7–4.7)
Alkaline Phosphatase: 58 IU/L (ref 48–121)
Bilirubin Total: 1.5 mg/dL — ABNORMAL HIGH (ref 0.0–1.2)
Bilirubin, Direct: 0.39 mg/dL (ref 0.00–0.40)
Total Protein: 6.6 g/dL (ref 6.0–8.5)

## 2019-05-31 LAB — TSH: TSH: 3.01 u[IU]/mL (ref 0.450–4.500)

## 2019-06-06 ENCOUNTER — Telehealth: Payer: Self-pay | Admitting: Cardiology

## 2019-06-06 NOTE — Addendum Note (Signed)
Addended by: Truddie Hidden on: 06/06/2019 02:14 PM   Modules accepted: Orders

## 2019-06-06 NOTE — Telephone Encounter (Signed)
Call transferred to [facility].

## 2019-06-21 ENCOUNTER — Ambulatory Visit: Payer: Medicare Other | Admitting: Cardiology

## 2019-07-01 ENCOUNTER — Other Ambulatory Visit: Payer: Self-pay | Admitting: Physician Assistant

## 2019-07-04 ENCOUNTER — Other Ambulatory Visit: Payer: Self-pay | Admitting: Cardiology

## 2019-07-05 ENCOUNTER — Ambulatory Visit: Payer: Medicare Other | Admitting: Cardiology

## 2019-07-05 ENCOUNTER — Other Ambulatory Visit: Payer: Self-pay

## 2019-07-05 ENCOUNTER — Encounter: Payer: Self-pay | Admitting: Cardiology

## 2019-07-05 VITALS — BP 132/72 | HR 70 | Ht 67.0 in | Wt 246.0 lb

## 2019-07-05 DIAGNOSIS — E782 Mixed hyperlipidemia: Secondary | ICD-10-CM | POA: Diagnosis not present

## 2019-07-05 DIAGNOSIS — I25118 Atherosclerotic heart disease of native coronary artery with other forms of angina pectoris: Secondary | ICD-10-CM | POA: Diagnosis not present

## 2019-07-05 DIAGNOSIS — I10 Essential (primary) hypertension: Secondary | ICD-10-CM

## 2019-07-05 DIAGNOSIS — N289 Disorder of kidney and ureter, unspecified: Secondary | ICD-10-CM | POA: Diagnosis not present

## 2019-07-05 MED ORDER — RANOLAZINE ER 500 MG PO TB12: 500.0000 mg | ORAL_TABLET | Freq: Two times a day (BID) | ORAL | 6 refills | Status: DC

## 2019-07-05 NOTE — Progress Notes (Signed)
Cardiology Office Note:    Date:  07/05/2019   ID:  Robert Peterson, [place] 1945/11/13, MRN [id number]  PCP:  Algis Greenhouse, MD  Cardiologist:  Jenean Lindau, MD   Referring MD: Algis Greenhouse, MD    ASSESSMENT:    1. Coronary artery disease of native artery of native heart with stable angina pectoris ([place])   2. Essential hypertension   3. Mixed hyperlipidemia   4. Renal insufficiency    PLAN:    In order of problems listed above:  1. Coronary artery disease: Secondary prevention stressed to the patient.  Importance of compliance with diet medication stressed and vocalized understanding.  Compliance with diet and exercise stressed weight reduction stressed and he promises to comply. 2. Stable angina: In view of the symptoms have added Ranexa 500 mg twice daily. 3. Essential hypertension: Blood pressure stable 4. Mixed dyslipidemia: Lipids were reviewed and discussed with patient 5. Renal insufficiency: I discussed this.  His renal insufficiency is stable. 6. Patient will be seen in follow-up appointment in 1 month or earlier if the patient has any concerns    Medication Adjustments/Labs and Tests Ordered: Current medicines are reviewed at length with the patient today.  Concerns regarding medicines are outlined above.  No orders of the defined types were placed in this encounter.  No orders of the defined types were placed in this encounter.    Chief Complaint  Patient presents with  . Follow-up     History of Present Illness:    Robert Peterson is a 74 y.o. male.  Patient has past medical history of coronary artery disease, essential hypertension dyslipidemia and renal insufficiency.  She denies any problems at this time and takes care of activities of daily living.  No chest pain orthopnea or PND.  He occasionally uses nitroglycerin for what appears to be stable angina.  At the time of my evaluation, the patient is alert awake oriented and in no  distress.  Past Medical History:  Diagnosis Date  . Acute kidney injury ([place]) 04/15/2018   2020: dehydration related to septic bursitis, hosp; minor changes  . Angina pectoris ([place]) 04/25/2019  . Aortic valve sclerosis 04/02/2018  . Ascending aortic aneurysm ([place])   . Bilateral lower extremity edema 04/16/2018  . CAD (coronary artery disease)    a. Effort angina -> cath 04/2015 s/p drug-eluting stent placement to the mid LAD, residual CAD managed medically, normal LVEF  . Cellulitis of right lower extremity 03/17/2018  . Chronic lumbar pain 08/01/2010  . CKD (chronic kidney disease), stage II   . COPD (chronic obstructive pulmonary disease) ([place])   . Coronary artery disease of native artery of native heart with stable angina pectoris ([place]) 04/25/2019   Formatting of this note might be different from the original. Cardiac Cath 04/29/19 Conclusion  Dist RCA lesion is 50% stenosed.  Ost LAD to Prox LAD lesion is 60% stenosed.  Mid LAD lesion is 80% stenosed.  Post intervention, there is a 0% residual stenosis.  A drug-eluting stent was successfully placed using a STENT RESOLUTE ONYX 2.75X30.  Ost Cx to Prox Cx lesion is 50% stenosed.  . Diabetes ([place])   . DOE (dyspnea on exertion) 03/17/2019  . Effort angina ([place]) 04/29/2019  . Encounter for laboratory testing for COVID-19 virus 05/20/2018   Formatting of this note might be different from the original. 2020: DCDMV  Formatting of this note might be different from the original. 2020  . Erectile dysfunction 02/16/2016  .  Essential hypertension 05/08/2015   1991: dx  . Gastroesophageal reflux disease without esophagitis 05/08/2015  . Glaucoma 05/08/2015  . Hyperlipidemia   . Hypertension   . Hypothyroidism 05/08/2015  . Mild intermittent asthma without complication 7/65/4650  . Mixed hyperlipidemia 05/08/2015  . Morbid obesity ([place]) 03/17/2019  . Murmur 02/24/2018  . OSA on CPAP 11/20/2015  . Renal insufficiency 04/25/2019  . Restless leg syndrome 05/08/2015   . Septic prepatellar bursitis of right knee 04/15/2018   2020: hosp, MRSA, I/D  . Swelling 02/24/2018  . Tachycardia 04/02/2018  . Type 2 diabetes mellitus, with long-term current use of insulin ([place]) 05/08/2015   2002: dx 6.1 XXXX: CKD3    Past Surgical History:  Procedure Laterality Date  . CORONARY STENT INTERVENTION N/A 04/29/2019   Procedure: CORONARY STENT INTERVENTION;  Surgeon: Wellington Hampshire, MD;  Location: [place] [facility];  Service: Cardiovascular;  Laterality: N/A;  . INTRAVASCULAR PRESSURE WIRE/FFR STUDY N/A 04/29/2019   Procedure: INTRAVASCULAR PRESSURE WIRE/FFR STUDY;  Surgeon: Wellington Hampshire, MD;  Location: [place] [facility];  Service: Cardiovascular;  Laterality: N/A;  . LEFT HEART CATH AND CORONARY ANGIOGRAPHY N/A 04/29/2019   Procedure: LEFT HEART CATH AND CORONARY ANGIOGRAPHY;  Surgeon: Wellington Hampshire, MD;  Location: [place] [facility];  Service: Cardiovascular;  Laterality: N/A;    Current Medications: Current Meds  Medication Sig  . amLODipine (NORVASC) 5 MG tablet Take 1 tablet (5 mg total) by mouth daily.  Marland Kitchen aspirin EC 81 MG tablet Take 1 tablet (81 mg total) by mouth daily.  Marland Kitchen atorvastatin (LIPITOR) 80 MG tablet Take 80 mg by mouth daily.   . clopidogrel (PLAVIX) 75 MG tablet Take 1 tablet (75 mg total) by mouth daily.  . fluticasone furoate-vilanterol (BREO ELLIPTA) 100-25 MCG/INH AEPB Inhale 1 puff into the lungs daily.  Marland Kitchen HYDROcodone-acetaminophen (NORCO/VICODIN) 5-325 MG tablet Take 1 tablet by mouth as needed for severe pain.   Marland Kitchen insulin degludec (TRESIBA FLEXTOUCH) 100 UNIT/ML FlexTouch Pen Inject 55 Units into the skin daily.  . irbesartan (AVAPRO) 300 MG tablet Take 300 mg by mouth every morning.  . latanoprost (XALATAN) 0.005 % ophthalmic solution Place 1 drop into both eyes at bedtime.  Marland Kitchen levothyroxine (SYNTHROID) 75 MCG tablet Take 75 mcg by mouth daily.   . metFORMIN (GLUCOPHAGE) 1000 MG tablet Take 1,000 mg by mouth 2 (two) times daily with a  meal.   . metoprolol succinate (TOPROL-XL) 50 MG 24 hr tablet Take 50 mg by mouth daily.   . nitroGLYCERIN (NITROSTAT) 0.4 MG SL tablet Place 1 tablet (0.4 mg total) under the tongue every 5 (five) minutes as needed for chest pain.  . pantoprazole (PROTONIX) 40 MG tablet Take 1 tablet (40 mg total) by mouth daily.  Marland Kitchen spironolactone (ALDACTONE) 25 MG tablet TAKE ONE TABLET BY MOUTH EVERY MORNING FOR swelling     Allergies:   Patient has no known allergies.   Social History   Socioeconomic History  . Marital status: Married    Spouse name: Not on file  . Number of children: Not on file  . Years of education: Not on file  . Highest education level: Not on file  Occupational History  . Not on file  Tobacco Use  . Smoking status: Never Smoker  . Smokeless tobacco: Never Used  Substance and Sexual Activity  . Alcohol use: Never  . Drug use: Never  . Sexual activity: Not on file  Other Topics Concern  . Not on  file  Social History Narrative  . Not on file   Social Determinants of Health   Financial Resource Strain:   . Difficulty of Paying Living Expenses:   Food Insecurity:   . Worried About Charity fundraiser in the Last Year:   . Arboriculturist in the Last Year:   Transportation Needs:   . Film/video editor (Medical):   Marland Kitchen Lack of Transportation (Non-Medical):   Physical Activity:   . Days of Exercise per Week:   . Minutes of Exercise per Session:   Stress:   . Feeling of Stress :   Social Connections:   . Frequency of Communication with Friends and Family:   . Frequency of Social Gatherings with Friends and Family:   . Attends Religious Services:   . Active Member of Clubs or Organizations:   . Attends Archivist Meetings:   Marland Kitchen Marital Status:      Family History: The patient's family history is not on file.  ROS:   Please see the history of present illness.    All other systems reviewed and are negative.  EKGs/Labs/Other Studies Reviewed:     The following studies were reviewed today: CORONARY STENT INTERVENTION  INTRAVASCULAR PRESSURE WIRE/FFR STUDY  LEFT HEART CATH AND CORONARY ANGIOGRAPHY  Conclusion    Dist RCA lesion is 50% stenosed.  Ost LAD to Prox LAD lesion is 60% stenosed.  Mid LAD lesion is 80% stenosed.  Post intervention, there is a 0% residual stenosis.  A drug-eluting stent was successfully placed using a STENT RESOLUTE ONYX 2.75X30.  Ost Cx to Prox Cx lesion is 50% stenosed.   1.  Significant one-vessel coronary artery disease involving the mid LAD with moderate stenosis affecting the ostial LAD (not significant by fractional flow reserve-DFR of 0.94), moderate ostial left circumflex stenosis and moderate distal RCA stenosis. 2.  Mildly elevated left ventricular end-diastolic pressure at 13 mmHg. 3.  Successful angioplasty and drug-eluting stent placement to the mid LAD.  Very difficult procedure due to severe tortuosity of the innominate artery leading to poor support from the guide catheter.  Recommendations: Dual antiplatelet therapy for at least 6 months. Aggressive treatment of residual coronary artery disease. Avoid catheterization via right radial artery in the future due to innominate artery tortuosity. If ostial LAD becomes significant in the future, it would be difficult to treat percutaneously as it will require pinching the ramus and the left circumflex. Given that we reached our contrast limit, I am going to hydrate him overnight to decrease the chance of contrast-induced nephropathy.     Recent Labs: 04/30/2019: Hemoglobin 13.9; Platelets 205 05/31/2019: ALT 20; BUN 29; Creatinine, Ser 1.33; Potassium 4.4; Sodium 135; TSH 3.010  Recent Lipid Panel    Component Value Date/Time   CHOL 112 05/31/2019 0834   TRIG 112 05/31/2019 0834   HDL 32 (L) 05/31/2019 0834   CHOLHDL 3.5 05/31/2019 0834   LDLCALC 59 05/31/2019 0834    Physical Exam:    VS:  BP 132/72   Pulse 70   Ht 5'  7" (1.702 m)   Wt 246 lb (111.6 kg)   SpO2 95%   BMI 38.53 kg/m     Wt Readings from Last 3 Encounters:  07/05/19 246 lb (111.6 kg)  05/30/19 243 lb (110.2 kg)  04/30/19 231 lb 14.4 oz (105.2 kg)     GEN: Patient is in no acute distress HEENT: Normal NECK: No JVD; No carotid bruits LYMPHATICS: No lymphadenopathy  CARDIAC: Hear sounds regular, 2/6 systolic murmur at the apex. RESPIRATORY:  Clear to auscultation without rales, wheezing or rhonchi  ABDOMEN: Soft, non-tender, non-distended MUSCULOSKELETAL:  No edema; No deformity  SKIN: Warm and dry NEUROLOGIC:  Alert and oriented x 3 PSYCHIATRIC:  Normal affect   Signed, Jenean Lindau, MD  07/05/2019 4:20 PM    [facility]

## 2019-07-05 NOTE — Patient Instructions (Signed)
Medication Instructions:  Your physician has recommended you make the following change in your medication:   Start Ranexa 500 mg twice daily.  *If you need a refill on your cardiac medications before your next appointment, please call your pharmacy*   Lab Work: None ordered If you have labs (blood work) drawn today and your tests are completely normal, you will receive your results only by: . MyChart Message (if you have MyChart) OR . A paper copy in the mail If you have any lab test that is abnormal or we need to change your treatment, we will call you to review the results.   Testing/Procedures: None ordered   Follow-Up: At [facility], you and your health needs are our priority.  As part of our continuing mission to provide you with exceptional heart care, we have created designated Provider Care Teams.  These Care Teams include your primary Cardiologist (physician) and Advanced Practice Providers (APPs -  Physician Assistants and Nurse Practitioners) who all work together to provide you with the care you need, when you need it.  We recommend signing up for the patient portal called "MyChart".  Sign up information is provided on this After Visit Summary.  MyChart is used to connect with patients for Virtual Visits (Telemedicine).  Patients are able to view lab/test results, encounter notes, upcoming appointments, etc.  Non-urgent messages can be sent to your provider as well.   To learn more about what you can do with MyChart, go to [email or web address].    Your next appointment:   1 month(s)  The format for your next appointment:   In Person  Provider:   Rajan Revankar, MD   Other Instructions Ranolazine tablets, extended release What is this medicine? RANOLAZINE (ra NOE la zeen) is a heart medicine. It is used to treat chronic chest pain (angina). This medicine must be taken regularly. It will not relieve an acute episode of chest pain. This medicine may be used for  other purposes; ask your health care provider or pharmacist if you have questions. COMMON BRAND NAME(S): Ranexa What should I tell my health care provider before I take this medicine? They need to know if you have any of these conditions:  heart disease  irregular heartbeat  kidney disease  liver disease  low levels of potassium or magnesium in the blood  an unusual or allergic reaction to ranolazine, other medicines, foods, dyes, or preservatives  pregnant or trying to get pregnant  breast-feeding How should I use this medicine? Take this medicine by mouth with a glass of water. Follow the directions on the prescription label. Do not cut, crush, or chew this medicine. Take with or without food. Do not take this medication with grapefruit juice. Take your doses at regular intervals. Do not take your medicine more often then directed. Talk to your pediatrician regarding the use of this medicine in children. Special care may be needed. Overdosage: If you think you have taken too much of this medicine contact a poison control center or emergency room at once. NOTE: This medicine is only for you. Do not share this medicine with others. What if I miss a dose? If you miss a dose, take it as soon as you can. If it is almost time for your next dose, take only that dose. Do not take double or extra doses. What may interact with this medicine? Do not take this medicine with any of the following medications:  antivirals for HIV or AIDS  cerivastatin    certain antibiotics like chloramphenicol, clarithromycin, dalfopristin; quinupristin, isoniazid, rifabutin, rifampin, rifapentine  certain medicines used for cancer like imatinib, nilotinib  certain medicines for fungal infections like fluconazole, itraconazole, ketoconazole, posaconazole, voriconazole  certain medicines for irregular heart beat like dronedarone  certain medicines for seizures like carbamazepine, fosphenytoin,  oxcarbazepine, phenobarbital, phenytoin  cisapride  conivaptan  cyclosporine  grapefruit or grapefruit juice  lumacaftor; ivacaftor  nefazodone  pimozide  quinacrine  St John's wort  thioridazine This medicine may also interact with the following medications:  alfuzosin  certain medicines for depression, anxiety, or psychotic disturbances like bupropion, citalopram, fluoxetine, fluphenazine, paroxetine, perphenazine, risperidone, sertraline, trifluoperazine  certain medicines for cholesterol like atorvastatin, lovastatin, simvastatin  certain medicines for stomach problems like octreotide, palonosetron, prochlorperazine  eplerenone  ergot alkaloids like dihydroergotamine, ergonovine, ergotamine, methylergonovine  metformin  nicardipine  other medicines that prolong the QT interval (cause an abnormal heart rhythm) like dofetilide, ziprasidone  sirolimus  tacrolimus This list may not describe all possible interactions. Give your health care provider a list of all the medicines, herbs, non-prescription drugs, or dietary supplements you use. Also tell them if you smoke, drink alcohol, or use illegal drugs. Some items may interact with your medicine. What should I watch for while using this medicine? Visit your doctor for regular check ups. Tell your doctor or healthcare professional if your symptoms do not start to get better or if they get worse. This medicine will not relieve an acute attack of angina or chest pain. This medicine can change your heart rhythm. Your health care provider may check your heart rhythm by ordering an electrocardiogram (ECG) while you are taking this medicine. You may get drowsy or dizzy. Do not drive, use machinery, or do anything that needs mental alertness until you know how this medicine affects you. Do not stand or sit up quickly, especially if you are an older patient. This reduces the risk of dizzy or fainting spells. Alcohol may  interfere with the effect of this medicine. Avoid alcoholic drinks. If you are scheduled for any medical or dental procedure, tell your healthcare provider that you are taking this medicine. This medicine can interact with other medicines used during surgery. What side effects may I notice from receiving this medicine? Side effects that you should report to your doctor or health care professional as soon as possible:  allergic reactions like skin rash, itching or hives, swelling of the face, lips, or tongue  breathing problems  changes in vision  fast, irregular or pounding heartbeat  feeling faint or lightheaded, falls  low or high blood pressure  numbness or tingling feelings  ringing in the ears  tremor or shakiness  slow heartbeat (fewer than 50 beats per minute)  swelling of the legs or feet Side effects that usually do not require medical attention (report to your doctor or health care professional if they continue or are bothersome):  constipation  drowsy  dry mouth  headache  nausea or vomiting  stomach upset This list may not describe all possible side effects. Call your doctor for medical advice about side effects. You may report side effects to FDA at 1-800-FDA-1088. Where should I keep my medicine? Keep out of the reach of children. Store at room temperature between 15 and 30 degrees C (59 and 86 degrees F). Throw away any unused medicine after the expiration date. NOTE: This sheet is a summary. It may not cover all possible information. If you have questions about this medicine, talk to your   doctor, pharmacist, or health care provider.  2020 Elsevier/Gold Standard (2017-12-22 09:18:49)   

## 2019-08-02 DIAGNOSIS — I951 Orthostatic hypotension: Secondary | ICD-10-CM

## 2019-08-02 HISTORY — DX: Orthostatic hypotension: I95.1

## 2019-08-10 ENCOUNTER — Other Ambulatory Visit: Payer: Self-pay

## 2019-08-10 DIAGNOSIS — E785 Hyperlipidemia, unspecified: Secondary | ICD-10-CM | POA: Insufficient documentation

## 2019-08-10 DIAGNOSIS — I2510 Atherosclerotic heart disease of native coronary artery without angina pectoris: Secondary | ICD-10-CM | POA: Insufficient documentation

## 2019-08-10 DIAGNOSIS — I7121 Aneurysm of the ascending aorta, without rupture: Secondary | ICD-10-CM | POA: Insufficient documentation

## 2019-08-10 DIAGNOSIS — E119 Type 2 diabetes mellitus without complications: Secondary | ICD-10-CM | POA: Insufficient documentation

## 2019-08-10 DIAGNOSIS — J449 Chronic obstructive pulmonary disease, unspecified: Secondary | ICD-10-CM | POA: Insufficient documentation

## 2019-08-10 DIAGNOSIS — I10 Essential (primary) hypertension: Secondary | ICD-10-CM | POA: Insufficient documentation

## 2019-08-11 ENCOUNTER — Other Ambulatory Visit: Payer: Self-pay

## 2019-08-11 ENCOUNTER — Ambulatory Visit (INDEPENDENT_AMBULATORY_CARE_PROVIDER_SITE_OTHER): Payer: Medicare Other | Admitting: Cardiology

## 2019-08-11 ENCOUNTER — Encounter: Payer: Self-pay | Admitting: Cardiology

## 2019-08-11 VITALS — BP 128/62 | HR 76 | Ht 62.0 in | Wt 239.6 lb

## 2019-08-11 DIAGNOSIS — I2510 Atherosclerotic heart disease of native coronary artery without angina pectoris: Secondary | ICD-10-CM | POA: Diagnosis not present

## 2019-08-11 DIAGNOSIS — I7121 Aneurysm of the ascending aorta, without rupture: Secondary | ICD-10-CM

## 2019-08-11 DIAGNOSIS — E782 Mixed hyperlipidemia: Secondary | ICD-10-CM

## 2019-08-11 DIAGNOSIS — I712 Thoracic aortic aneurysm, without rupture: Secondary | ICD-10-CM | POA: Diagnosis not present

## 2019-08-11 DIAGNOSIS — I10 Essential (primary) hypertension: Secondary | ICD-10-CM

## 2019-08-11 DIAGNOSIS — E088 Diabetes mellitus due to underlying condition with unspecified complications: Secondary | ICD-10-CM

## 2019-08-11 HISTORY — DX: Diabetes mellitus due to underlying condition with unspecified complications: E08.8

## 2019-08-11 NOTE — Patient Instructions (Signed)
Medication Instructions:  Your physician recommends that you continue on your current medications as directed. Please refer to the Current Medication list given to you today.  *If you need a refill on your cardiac medications before your next appointment, please call your pharmacy*   Lab Work: Your physician recommends that you return for lab work today: bmp   If you have labs (blood work) drawn today and your tests are completely normal, you will receive your results only by:  [place] (if you have MyChart) OR  A paper copy in the mail If you have any lab test that is abnormal or we need to change your treatment, we will call you to review the results.   Testing/Procedures: None    Follow-Up: At [facility], you and your health needs are our priority.  As part of our continuing mission to provide you with exceptional heart care, we have created designated Provider Care Teams.  These Care Teams include your primary Cardiologist (physician) and Advanced Practice Providers (APPs -  Physician Assistants and Nurse Practitioners) who all work together to provide you with the care you need, when you need it.  We recommend signing up for the patient portal called "MyChart".  Sign up information is provided on this After Visit Summary.  MyChart is used to connect with patients for Virtual Visits (Telemedicine).  Patients are able to view lab/test results, encounter notes, upcoming appointments, etc.  Non-urgent messages can be sent to your provider as well.   To learn more about what you can do with MyChart, go to [email or web address].    Your next appointment:   4 month(s)  The format for your next appointment:   In Person  Provider:   Jyl Heinz, MD   Other Instructions

## 2019-08-11 NOTE — Progress Notes (Signed)
Cardiology Office Note:    Date:  08/11/2019   ID:  Robert Peterson, [place] November 23, 1945, MRN [id number]  PCP:  Algis Greenhouse, MD  Cardiologist:  Jenean Lindau, MD   Referring MD: Algis Greenhouse, MD    ASSESSMENT:    1. Ascending aortic aneurysm ([place])   2. Coronary artery disease involving native coronary artery of native heart without angina pectoris   3. Essential hypertension   4. Mixed hyperlipidemia   5. Diabetes mellitus due to underlying condition with unspecified complications ([place])    PLAN:    In order of problems listed above:  1. Coronary artery disease: Secondary prevention stressed with patient.  Importance of compliance with diet medication stressed and he vocalized understanding.  I told him to walk on a regular basis and he promises to do better 2. Essential hypertension: Blood pressure is stable diet was emphasized 3. Mixed dyslipidemia and diabetes mellitus: Diet was emphasized importance of regular walking stressed weight reduction was stressed lipids were reviewed with him 4. Morbid obesity: Weight reduction was stressed diet was emphasized risks of obesity explained 5. Renal insufficiency: We will do a Chem-7 today and follow-up.  This will be essentially followed by his primary care physician for long-term 6. Patient will be seen in follow-up appointment in 4 months or earlier if the patient has any concerns    Medication Adjustments/Labs and Tests Ordered: Current medicines are reviewed at length with the patient today.  Concerns regarding medicines are outlined above.  No orders of the defined types were placed in this encounter.  No orders of the defined types were placed in this encounter.    No chief complaint on file.    History of Present Illness:    Robert Peterson is a 74 y.o. male.  Patient has past medical history of coronary artery disease post intervention as indicated below essential hypertension dyslipidemia diabetes  mellitus and dilated ascending aorta.  He denies any problems at this time and takes care of activities of daily living.  No chest pain orthopnea or PND.  At the time of my evaluation, the patient is alert awake oriented and in no distress.  Past Medical History:  Diagnosis Date  . Acute kidney injury ([place]) 04/15/2018   2020: dehydration related to septic bursitis, hosp; minor changes  . Angina pectoris ([place]) 04/25/2019  . Aortic valve sclerosis 04/02/2018  . Ascending aortic aneurysm ([place])   . Bilateral lower extremity edema 04/16/2018  . CAD (coronary artery disease)    a. Effort angina -> cath 04/2015 s/p drug-eluting stent placement to the mid LAD, residual CAD managed medically, normal LVEF  . Cellulitis of right lower extremity 03/17/2018  . Chronic lumbar pain 08/01/2010  . CKD (chronic kidney disease), stage II   . COPD (chronic obstructive pulmonary disease) ([place])   . Coronary artery disease of native artery of native heart with stable angina pectoris ([place]) 04/25/2019   Formatting of this note might be different from the original. Cardiac Cath 04/29/19 Conclusion  Dist RCA lesion is 50% stenosed.  Ost LAD to Prox LAD lesion is 60% stenosed.  Mid LAD lesion is 80% stenosed.  Post intervention, there is a 0% residual stenosis.  A drug-eluting stent was successfully placed using a STENT RESOLUTE ONYX 2.75X30.  Ost Cx to Prox Cx lesion is 50% stenosed.  . Diabetes ([place])   . DOE (dyspnea on exertion) 03/17/2019  . Effort angina ([place]) 04/29/2019  . Encounter for laboratory testing for  COVID-19 virus 05/20/2018   Formatting of this note might be different from the original. 2020: DCDMV  Formatting of this note might be different from the original. 2020  . Erectile dysfunction 02/16/2016  . Essential hypertension 05/08/2015   1991: dx  . Gastroesophageal reflux disease without esophagitis 05/08/2015  . Glaucoma 05/08/2015  . Hyperlipidemia   . Hypertension   . Hypothyroidism 05/08/2015  . 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 2019: hosp 2021: hosp  . Mild intermittent asthma without complication 2/54/2706  . Mixed hyperlipidemia 05/08/2015  . Morbid obesity ([place]) 03/17/2019  . Murmur 02/24/2018  . Orthostatic hypotension 08/02/2019   Formatting of this note might be different from the original. 2021: adm  . OSA on CPAP 11/20/2015  . Renal insufficiency 04/25/2019  . Restless leg syndrome 05/08/2015  . Septic prepatellar bursitis of right knee 04/15/2018   2020: hosp, MRSA, I/D  . Swelling 02/24/2018  . Tachycardia 04/02/2018  . Type 2 diabetes mellitus, with long-term current use of insulin ([place]) 05/08/2015   2002: dx 6.1 XXXX: CKD3    Past Surgical History:  Procedure Laterality Date  . CORONARY STENT INTERVENTION N/A 04/29/2019   Procedure: CORONARY STENT INTERVENTION;  Surgeon: Wellington Hampshire, MD;  Location: [facility];  Service: Cardiovascular;  Laterality: N/A;  . INTRAVASCULAR PRESSURE WIRE/FFR STUDY N/A 04/29/2019   Procedure: INTRAVASCULAR PRESSURE WIRE/FFR STUDY;  Surgeon: Wellington Hampshire, MD;  Location: [facility];  Service: Cardiovascular;  Laterality: N/A;  . LEFT HEART CATH AND CORONARY ANGIOGRAPHY N/A 04/29/2019   Procedure: LEFT HEART CATH AND CORONARY ANGIOGRAPHY;  Surgeon: Wellington Hampshire, MD;  Location: [facility];  Service: Cardiovascular;  Laterality: N/A;    Current Medications: Current Meds  Medication Sig  . amLODipine (NORVASC) 5 MG tablet Take 1 tablet (5 mg total) by mouth daily.  Marland Kitchen aspirin EC 81 MG tablet Take 1 tablet (81 mg total) by mouth daily.  Marland Kitchen atorvastatin (LIPITOR) 80 MG tablet Take 80 mg by mouth daily.   . clopidogrel (PLAVIX) 75 MG tablet Take 1 tablet (75 mg total) by mouth daily.  . fluticasone furoate-vilanterol (BREO ELLIPTA) 100-25 MCG/INH AEPB Inhale 1 puff into the lungs daily.  . furosemide (LASIX) 40 MG tablet Take 20 mg by mouth daily.  Marland Kitchen  HYDROcodone-acetaminophen (NORCO/VICODIN) 5-325 MG tablet Take 1 tablet by mouth as needed for severe pain.   . Insulin Degludec (TRESIBA) 100 UNIT/ML SOLN Inject 65 Units into the skin daily.  . irbesartan (AVAPRO) 300 MG tablet Take 300 mg by mouth every morning.  . latanoprost (XALATAN) 0.005 % ophthalmic solution Place 1 drop into both eyes at bedtime.  Marland Kitchen levothyroxine (SYNTHROID) 75 MCG tablet Take 75 mcg by mouth daily.   . metFORMIN (GLUCOPHAGE) 1000 MG tablet Take 1,000 mg by mouth 2 (two) times daily with a meal.   . metoprolol succinate (TOPROL-XL) 50 MG 24 hr tablet Take 50 mg by mouth daily.   . pantoprazole (PROTONIX) 40 MG tablet Take 1 tablet (40 mg total) by mouth daily.  . ranolazine (RANEXA) 500 MG 12 hr tablet Take 1 tablet (500 mg total) by mouth 2 (two) times daily.  Marland Kitchen spironolactone (ALDACTONE) 25 MG tablet TAKE ONE TABLET BY MOUTH EVERY MORNING FOR swelling     Allergies:   Amlodipine   Social History   Socioeconomic History  . Marital status: Married    Spouse name: Not on  file  . Number of children: Not on file  . Years of education: Not on file  . Highest education level: Not on file  Occupational History  . Not on file  Tobacco Use  . Smoking status: Never Smoker  . Smokeless tobacco: Never Used  Substance and Sexual Activity  . Alcohol use: Never  . Drug use: Never  . Sexual activity: Not on file  Other Topics Concern  . Not on file  Social History Narrative  . Not on file   Social Determinants of Health   Financial Resource Strain:   . Difficulty of Paying Living Expenses:   Food Insecurity:   . Worried About Charity fundraiser in the Last Year:   . Arboriculturist in the Last Year:   Transportation Needs:   . Film/video editor (Medical):   Marland Kitchen Lack of Transportation (Non-Medical):   Physical Activity:   . Days of Exercise per Week:   . Minutes of Exercise per Session:   Stress:   . Feeling of Stress :   Social Connections:   .  Frequency of Communication with Friends and Family:   . Frequency of Social Gatherings with Friends and Family:   . Attends Religious Services:   . Active Member of Clubs or Organizations:   . Attends Archivist Meetings:   Marland Kitchen Marital Status:      Family History: The patient's family history is not on file.  ROS:   Please see the history of present illness.    All other systems reviewed and are negative.  EKGs/Labs/Other Studies Reviewed:    The following studies were reviewed today: Wellington Hampshire, MD (Primary)    Procedures  CORONARY STENT INTERVENTION  INTRAVASCULAR PRESSURE WIRE/FFR STUDY  LEFT HEART CATH AND CORONARY ANGIOGRAPHY  Conclusion    Dist RCA lesion is 50% stenosed.  Ost LAD to Prox LAD lesion is 60% stenosed.  Mid LAD lesion is 80% stenosed.  Post intervention, there is a 0% residual stenosis.  A drug-eluting stent was successfully placed using a STENT RESOLUTE ONYX 2.75X30.  Ost Cx to Prox Cx lesion is 50% stenosed.   1.  Significant one-vessel coronary artery disease involving the mid LAD with moderate stenosis affecting the ostial LAD (not significant by fractional flow reserve-DFR of 0.94), moderate ostial left circumflex stenosis and moderate distal RCA stenosis. 2.  Mildly elevated left ventricular end-diastolic pressure at 13 mmHg. 3.  Successful angioplasty and drug-eluting stent placement to the mid LAD.  Very difficult procedure due to severe tortuosity of the innominate artery leading to poor support from the guide catheter.  Recommendations: Dual antiplatelet therapy for at least 6 months. Aggressive treatment of residual coronary artery disease. Avoid catheterization via right radial artery in the future due to innominate artery tortuosity. If ostial LAD becomes significant in the future, it would be difficult to treat percutaneously as it will require pinching the ramus and the left circumflex. Given that we reached our  contrast limit, I am going to hydrate him overnight to decrease the chance of contrast-induced nephropathy.     Recent Labs: 04/30/2019: Hemoglobin 13.9; Platelets 205 05/31/2019: ALT 20; BUN 29; Creatinine, Ser 1.33; Potassium 4.4; Sodium 135; TSH 3.010  Recent Lipid Panel    Component Value Date/Time   CHOL 112 05/31/2019 0834   TRIG 112 05/31/2019 0834   HDL 32 (L) 05/31/2019 0834   CHOLHDL 3.5 05/31/2019 0834   LDLCALC 59 05/31/2019 8527  Physical Exam:    VS:  BP (!) 128/62   Pulse 76   Ht 5\' 2"  (1.575 m)   Wt (!) 239 lb 9.6 oz (108.7 kg)   SpO2 96%   BMI 43.82 kg/m     Wt Readings from Last 3 Encounters:  08/11/19 (!) 239 lb 9.6 oz (108.7 kg)  07/05/19 246 lb (111.6 kg)  05/30/19 243 lb (110.2 kg)     GEN: Patient is in no acute distress HEENT: Normal NECK: No JVD; No carotid bruits LYMPHATICS: No lymphadenopathy CARDIAC: Hear sounds regular, 2/6 systolic murmur at the apex. RESPIRATORY:  Clear to auscultation without rales, wheezing or rhonchi  ABDOMEN: Soft, non-tender, non-distended MUSCULOSKELETAL:  No edema; No deformity  SKIN: Warm and dry NEUROLOGIC:  Alert and oriented x 3 PSYCHIATRIC:  Normal affect   Signed, Jenean Lindau, MD  08/11/2019 8:22 AM    [facility]

## 2019-08-12 ENCOUNTER — Telehealth: Payer: Self-pay

## 2019-08-12 LAB — BASIC METABOLIC PANEL
BUN/Creatinine Ratio: 18 (ref 10–24)
BUN: 27 mg/dL (ref 8–27)
CO2: 22 mmol/L (ref 20–29)
Calcium: 9.2 mg/dL (ref 8.6–10.2)
Chloride: 97 mmol/L (ref 96–106)
Creatinine, Ser: 1.51 mg/dL — ABNORMAL HIGH (ref 0.76–1.27)
GFR calc Af Amer: 51 mL/min/{1.73_m2} — ABNORMAL LOW (ref >59)
GFR calc non Af Amer: 44 mL/min/{1.73_m2} — ABNORMAL LOW (ref >59)
Glucose: 281 mg/dL — ABNORMAL HIGH (ref 65–99)
Potassium: 4.3 mmol/L (ref 3.5–5.2)
Sodium: 133 mmol/L — ABNORMAL LOW (ref 134–144)

## 2019-08-12 NOTE — Telephone Encounter (Signed)
-----   Message from Jeanann Lewandowsky, [place] sent at 08/12/2019 10:25 AM EDT -----  ----- Message ----- From: Jenean Lindau, MD Sent: 08/12/2019  10:08 AM EDT To: Jeanann Lewandowsky, RMA  Renal function is stable.  The results of the study is unremarkable. Please inform patient. I will discuss in detail at next appointment. Cc  primary care/referring physician Jenean Lindau, MD 08/12/2019 10:08 AM

## 2019-08-12 NOTE — Telephone Encounter (Signed)
Left message on patients voicemail to please return our call.   

## 2019-08-16 ENCOUNTER — Telehealth: Payer: Self-pay

## 2019-08-16 NOTE — Telephone Encounter (Signed)
Left message on patients voicemail to please return our call.   

## 2019-08-16 NOTE — Telephone Encounter (Signed)
Spoke with patient regarding results and recommendation.  Patient verbalizes understanding and is agreeable to plan of care. Advised patient to call back with any issues or concerns.  

## 2019-08-16 NOTE — Telephone Encounter (Signed)
Daughter of the patient called back returning Morgan's Call about test results

## 2019-08-16 NOTE — Telephone Encounter (Signed)
-----   Message from Jeanann Lewandowsky, [place] sent at 08/12/2019 10:25 AM EDT -----  ----- Message ----- From: Jenean Lindau, MD Sent: 08/12/2019  10:08 AM EDT To: Jeanann Lewandowsky, RMA  Renal function is stable.  The results of the study is unremarkable. Please inform patient. I will discuss in detail at next appointment. Cc  primary care/referring physician Jenean Lindau, MD 08/12/2019 10:08 AM

## 2019-10-28 DIAGNOSIS — N1831 Chronic kidney disease, stage 3a: Secondary | ICD-10-CM

## 2019-10-28 HISTORY — DX: Chronic kidney disease, stage 3a: N18.31

## 2019-12-12 ENCOUNTER — Other Ambulatory Visit: Payer: Self-pay

## 2019-12-13 ENCOUNTER — Encounter: Payer: Self-pay | Admitting: Cardiology

## 2019-12-13 ENCOUNTER — Telehealth: Payer: Self-pay

## 2019-12-13 ENCOUNTER — Other Ambulatory Visit: Payer: Self-pay

## 2019-12-13 ENCOUNTER — Ambulatory Visit: Payer: Medicare Other | Admitting: Cardiology

## 2019-12-13 VITALS — BP 165/94 | HR 82 | Ht 66.0 in | Wt 246.0 lb

## 2019-12-13 DIAGNOSIS — I25118 Atherosclerotic heart disease of native coronary artery with other forms of angina pectoris: Secondary | ICD-10-CM | POA: Diagnosis not present

## 2019-12-13 DIAGNOSIS — I2510 Atherosclerotic heart disease of native coronary artery without angina pectoris: Secondary | ICD-10-CM

## 2019-12-13 DIAGNOSIS — I712 Thoracic aortic aneurysm, without rupture: Secondary | ICD-10-CM

## 2019-12-13 DIAGNOSIS — I7121 Aneurysm of the ascending aorta, without rupture: Secondary | ICD-10-CM

## 2019-12-13 DIAGNOSIS — I10 Essential (primary) hypertension: Secondary | ICD-10-CM

## 2019-12-13 DIAGNOSIS — E088 Diabetes mellitus due to underlying condition with unspecified complications: Secondary | ICD-10-CM

## 2019-12-13 DIAGNOSIS — E782 Mixed hyperlipidemia: Secondary | ICD-10-CM

## 2019-12-13 DIAGNOSIS — N182 Chronic kidney disease, stage 2 (mild): Secondary | ICD-10-CM

## 2019-12-13 MED ORDER — NITROGLYCERIN 0.4 MG SL SUBL: 0.4000 mg | SUBLINGUAL_TABLET | SUBLINGUAL | 6 refills | Status: DC | PRN

## 2019-12-13 MED ORDER — AMLODIPINE BESYLATE 5 MG PO TABS: 5.0000 mg | ORAL_TABLET | Freq: Every day | ORAL | 3 refills | Status: DC

## 2019-12-13 NOTE — Telephone Encounter (Signed)
Patient given bottle of Aspirin 81 mg in office. Lot #:[id number] Expiration: 01-23 °

## 2019-12-13 NOTE — Patient Instructions (Addendum)
Medication Instructions:  Your physician has recommended you make the following change in your medication:   Increase your Amlodipine to 5 mg daily. Take Nitroglycerin as needed for chest pain.  *If you need a refill on your cardiac medications before your next appointment, please call your pharmacy*   Lab Work: None ordered If you have labs (blood work) drawn today and your tests are completely normal, you will receive your results only by: Marland Kitchen MyChart Message (if you have MyChart) OR . A paper copy in the mail If you have any lab test that is abnormal or we need to change your treatment, we will call you to review the results.   Testing/Procedures: None ordered   Follow-Up: At [facility], you and your health needs are our priority.  As part of our continuing mission to provide you with exceptional heart care, we have created designated Provider Care Teams.  These Care Teams include your primary Cardiologist (physician) and Advanced Practice Providers (APPs -  Physician Assistants and Nurse Practitioners) who all work together to provide you with the care you need, when you need it.  We recommend signing up for the patient portal called "MyChart".  Sign up information is provided on this After Visit Summary.  MyChart is used to connect with patients for Virtual Visits (Telemedicine).  Patients are able to view lab/test results, encounter notes, upcoming appointments, etc.  Non-urgent messages can be sent to your provider as well.   To learn more about what you can do with MyChart, go to [email or web address].    Your next appointment:   6 month(s)  The format for your next appointment:   In Person  Provider:   Jyl Heinz, MD   Other Instructions  Blood Pressure Record Sheet To take your blood pressure, you will need a blood pressure machine. You can buy a blood pressure machine (blood pressure monitor) at your clinic, drug store, or online. When choosing one,  consider:  An automatic monitor that has an arm cuff.  A cuff that wraps snugly around your upper arm. You should be able to fit only one finger between your arm and the cuff.  A device that stores blood pressure reading results.  Do not choose a monitor that measures your blood pressure from your wrist or finger. Follow your health care provider's instructions for how to take your blood pressure. To use this form:  Get one reading in the morning (a.m.) 1-2 hours after you take any medicines.  Get one reading in the evening (p.m.) before supper.  Take at least 2 readings with each blood pressure check. This makes sure the results are correct. Wait 1-2 minutes between measurements.  Write down the results in the spaces on this form.  Repeat this once a week, or as told by your health care provider.  Make a follow-up appointment with your health care provider to discuss the results. Blood pressure log Date: _______________________  a.m. _____________________(1st reading) _____________________(2nd reading)  p.m. _____________________(1st reading) _____________________(2nd reading) Date: _______________________  a.m. _____________________(1st reading) _____________________(2nd reading)  p.m. _____________________(1st reading) _____________________(2nd reading) Date: _______________________  a.m. _____________________(1st reading) _____________________(2nd reading)  p.m. _____________________(1st reading) _____________________(2nd reading) Date: _______________________  a.m. _____________________(1st reading) _____________________(2nd reading)  p.m. _____________________(1st reading) _____________________(2nd reading) Date: _______________________  a.m. _____________________(1st reading) _____________________(2nd reading)  p.m. _____________________(1st reading) _____________________(2nd reading) This information is not intended to replace advice given to you by your health  care provider. Make sure you discuss any questions you have with your health  care provider. Document Revised: 02/27/2017 Document Reviewed: 12/30/2016 Elsevier Patient Education  [employer].

## 2019-12-13 NOTE — Progress Notes (Signed)
Cardiology Office Note:    Date:  12/13/2019   ID:  Robert Peterson, DOB 04/10/1945, MRN [id number]  PCP:  Robert Greenhouse, MD  Cardiologist:  Robert Lindau, MD   Referring MD: Robert Greenhouse, MD    ASSESSMENT:    1. Essential hypertension   2. Mixed hyperlipidemia   3. Coronary artery disease of native artery of native heart with stable angina pectoris ([place])   4. Coronary artery disease involving native coronary artery of native heart without angina pectoris   5. Diabetes mellitus due to underlying condition with unspecified complications ([place])   6. CKD (chronic kidney disease), stage II   7. Ascending aortic aneurysm (H[facility])    PLAN:    In order of problems listed above:  1. Coronary artery disease: Secondary prevention stressed with the patient.  Importance of compliance with diet medication stressed any vocalized understanding. 2. Essential hypertension: Blood pressure is elevated.  Lifestyle modification and salt intake issues were discussed.  I increase his amlodipine to 5 mg daily.  He will keep a track of his blood pressures and let us know in a week by sending [place] the blood pressure log.  I will titrate medications accordingly. Mixed dyslipidemia: Lipids were reviewed and discussed with the patient at length.  He is on statin therapy Diabetes mellitus and morbid obesity: Diet was emphasized and weight reduction was stressed and he promises to comply and do better.  His hemoglobin A1c on the KPN sheet was 12.5. Renal insufficiency: Stable and managed by primary care physician. Ascending aortic aneurysm: I discussed this with him and we will recheck in 6 months. Patient will be seen in follow-up appointment in 6 months or earlier if the patient has any concerns   Medication Adjustments/Labs and Tests Ordered: Current medicines are reviewed at length with the patient today.  Concerns regarding medicines are outlined above.  No orders of the defined types were placed  in this encounter.  No orders of the defined types were placed in this encounter.    No chief complaint on file.    History of Present Illness:    Robert Peterson is a 74 y.o. male.  Patient has past medical history of ascending aortic aneurysm, coronary artery disease, essential hypertension dyslipidemia and diabetes mellitus and morbid obesity.  He has renal insufficiency.  He denies any problems at this time and takes care of activities of daily living.  No chest pain orthopnea or PND.  At the time of my evaluation, the patient is alert awake oriented and in no distress.  He leads a sedentary lifestyle because of orthopedic issues involving his back.  He ambulates with a cane.  His daughter accompanies him for his visit today.  Past Medical History:  Diagnosis Date  . Acute kidney injury ([place]) 04/15/2018   2020: dehydration related to septic bursitis, hosp; minor changes  . Angina pectoris ([place]) 04/25/2019  . Aortic valve sclerosis 04/02/2018  . Ascending aortic aneurysm ([place])   . Bilateral lower extremity edema 04/16/2018  . CAD (coronary artery disease)    a. Effort angina -> cath 04/2015 s/p drug-eluting stent placement to the mid LAD, residual CAD managed medically, normal LVEF  . Cellulitis of right lower extremity 03/17/2018  . Chronic kidney disease, stage 3a ([place]) 10/28/2019   Formatting of this note might be different from the original. 2021: SONO - medical renal disease, no obstruction  . Chronic lumbar pain 08/01/2010  . CKD (chronic kidney disease), stage  II   . COPD (chronic obstructive pulmonary disease) ([place])   . Coronary artery disease of native artery of native heart with stable angina pectoris ([place]) 04/25/2019   Formatting of this note might be different from the original. Cardiac Cath 04/29/19 Conclusion  Dist RCA lesion is 50% stenosed.  Ost LAD to Prox LAD lesion is 60% stenosed.  Mid LAD lesion is 80% stenosed.  Post intervention, there is a 0% residual stenosis.   A drug-eluting stent was successfully placed using a STENT RESOLUTE ONYX 2.75X30.  Ost Cx to Prox Cx lesion is 50% stenosed.  . Diabetes ([place])   . Diabetes mellitus due to underlying condition with unspecified complications ([place]) 0/78/6754  . DOE (dyspnea on exertion) 03/17/2019  . Effort angina ([place]) 04/29/2019  . Encounter for laboratory testing for COVID-19 virus 05/20/2018   Formatting of this note might be different from the original. 2020: DCDMV  Formatting of this note might be different from the original. 2020  . Erectile dysfunction 02/16/2016  . Essential hypertension 05/08/2015   1991: dx  . Gastroesophageal reflux disease without esophagitis 05/08/2015  . Glaucoma 05/08/2015  . Hyperlipidemia   . Hypertension   . Hypothyroidism 05/08/2015  . 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 2019: hosp 2021: hosp  . Mild intermittent asthma without complication 4/92/0100  . Mixed hyperlipidemia 05/08/2015  . Morbid obesity ([place]) 03/17/2019  . Murmur 02/24/2018  . Orthostatic hypotension 08/02/2019   Formatting of this note might be different from the original. 2021: adm  . OSA on CPAP 11/20/2015  . Renal insufficiency 04/25/2019  . Restless leg syndrome 05/08/2015  . Septic prepatellar bursitis of right knee 04/15/2018   2020: hosp, MRSA, I/D  . Swelling 02/24/2018  . Tachycardia 04/02/2018  . Type 2 diabetes mellitus, with long-term current use of insulin ([place]) 05/08/2015   2002: dx 6.1 XXXX: CKD3    Past Surgical History:  Procedure Laterality Date  . CORONARY STENT INTERVENTION N/A 04/29/2019   Procedure: CORONARY STENT INTERVENTION;  Surgeon: Wellington Hampshire, MD;  Location: [facility];  Service: Cardiovascular;  Laterality: N/A;  . INTRAVASCULAR PRESSURE WIRE/FFR STUDY N/A 04/29/2019   Procedure: INTRAVASCULAR PRESSURE WIRE/FFR STUDY;  Surgeon: Wellington Hampshire, MD;  Location: [facility];  Service: Cardiovascular;   Laterality: N/A;  . LEFT HEART CATH AND CORONARY ANGIOGRAPHY N/A 04/29/2019   Procedure: LEFT HEART CATH AND CORONARY ANGIOGRAPHY;  Surgeon: Wellington Hampshire, MD;  Location: [facility];  Service: Cardiovascular;  Laterality: N/A;    Current Medications: Current Meds  Medication Sig  . amLODipine (NORVASC) 5 MG tablet Take 2.5 mg by mouth daily.  Marland Kitchen aspirin EC 81 MG tablet Take 1 tablet (81 mg total) by mouth daily.  Marland Kitchen atorvastatin (LIPITOR) 80 MG tablet Take 80 mg by mouth daily.   . clopidogrel (PLAVIX) 75 MG tablet Take 1 tablet (75 mg total) by mouth daily.  . fluticasone furoate-vilanterol (BREO ELLIPTA) 100-25 MCG/INH AEPB Inhale 1 puff into the lungs daily.  . furosemide (LASIX) 40 MG tablet Take 40 mg by mouth daily.   Marland Kitchen HYDROcodone-acetaminophen (NORCO/VICODIN) 5-325 MG tablet Take 1 tablet by mouth as needed for severe pain.   Marland Kitchen irbesartan (AVAPRO) 300 MG tablet Take 300 mg by mouth every morning.  . latanoprost (XALATAN) 0.005 % ophthalmic solution Place 1 drop into both eyes at bedtime.  Marland Kitchen levothyroxine (SYNTHROID) 75 MCG tablet Take 75 mcg by  mouth daily.   . metFORMIN (GLUCOPHAGE) 1000 MG tablet Take 1,000 mg by mouth 2 (two) times daily with a meal.   . metoprolol succinate (TOPROL-XL) 50 MG 24 hr tablet Take 50 mg by mouth daily.   . pantoprazole (PROTONIX) 40 MG tablet Take 1 tablet (40 mg total) by mouth daily.  . ranolazine (RANEXA) 500 MG 12 hr tablet Take 1 tablet (500 mg total) by mouth 2 (two) times daily.  Marland Kitchen spironolactone (ALDACTONE) 25 MG tablet TAKE ONE TABLET BY MOUTH EVERY MORNING FOR swelling  . TRESIBA FLEXTOUCH 100 UNIT/ML FlexTouch Pen Inject 55 Units into the skin at bedtime.     Allergies:   Amlodipine   Social History   Socioeconomic History  . Marital status: Married    Spouse name: Not on file  . Number of children: Not on file  . Years of education: Not on file  . Highest education level: Not on file  Occupational History  . Not on  file  Tobacco Use  . Smoking status: Never Smoker  . Smokeless tobacco: Never Used  Substance and Sexual Activity  . Alcohol use: Never  . Drug use: Never  . Sexual activity: Not on file  Other Topics Concern  . Not on file  Social History Narrative  . Not on file   Social Determinants of Health   Financial Resource Strain:   . Difficulty of Paying Living Expenses: Not on file  Food Insecurity:   . Worried About Charity fundraiser in the Last Year: Not on file  . Ran Out of Food in the Last Year: Not on file  Transportation Needs:   . Lack of Transportation (Medical): Not on file  . Lack of Transportation (Non-Medical): Not on file  Physical Activity:   . Days of Exercise per Week: Not on file  . Minutes of Exercise per Session: Not on file  Stress:   . Feeling of Stress : Not on file  Social Connections:   . Frequency of Communication with Friends and Family: Not on file  . Frequency of Social Gatherings with Friends and Family: Not on file  . Attends Religious Services: Not on file  . Active Member of Clubs or Organizations: Not on file  . Attends Archivist Meetings: Not on file  . Marital Status: Not on file     Family History: The patient's family history includes Cancer in his mother.  ROS:   Please see the history of present illness.    All other systems reviewed and are negative.  EKGs/Labs/Other Studies Reviewed:    The following studies were reviewed today: I discussed my findings with the patient in extensive length   Recent Labs: 04/30/2019: Hemoglobin 13.9; Platelets 205 05/31/2019: ALT 20; TSH 3.010 08/11/2019: BUN 27; Creatinine, Ser 1.51; Potassium 4.3; Sodium 133  Recent Lipid Panel    Component Value Date/Time   CHOL 112 05/31/2019 0834   TRIG 112 05/31/2019 0834   HDL 32 (L) 05/31/2019 0834   CHOLHDL 3.5 05/31/2019 0834   LDLCALC 59 05/31/2019 0834    Physical Exam:    VS:  BP (!) 165/94   Pulse 82   Ht 5\' 6"  (1.676 m)    Wt 246 lb (111.6 kg)   SpO2 96%   BMI 39.71 kg/m     Wt Readings from Last 3 Encounters:  12/13/19 246 lb (111.6 kg)  08/11/19 (!) 239 lb 9.6 oz (108.7 kg)  07/05/19 246 lb (111.6 kg)  GEN: Patient is in no acute distress HEENT: Normal NECK: No JVD; No carotid bruits LYMPHATICS: No lymphadenopathy CARDIAC: Hear sounds regular, 2/6 systolic murmur at the apex. RESPIRATORY:  Clear to auscultation without rales, wheezing or rhonchi  ABDOMEN: Soft, non-tender, non-distended MUSCULOSKELETAL:  No edema; No deformity  SKIN: Warm and dry NEUROLOGIC:  Alert and oriented x 3 PSYCHIATRIC:  Normal affect   Signed, Robert Lindau, MD  12/13/2019 9:12 AM    [place]

## 2019-12-29 ENCOUNTER — Other Ambulatory Visit: Payer: Self-pay | Admitting: Cardiology

## 2020-03-30 ENCOUNTER — Other Ambulatory Visit: Payer: Self-pay | Admitting: Cardiology

## 2020-04-04 ENCOUNTER — Other Ambulatory Visit: Payer: Self-pay | Admitting: Physician Assistant

## 2020-06-19 ENCOUNTER — Ambulatory Visit: Payer: Medicare Other | Admitting: Cardiology

## 2020-07-04 ENCOUNTER — Other Ambulatory Visit: Payer: Self-pay

## 2020-07-05 ENCOUNTER — Encounter: Payer: Self-pay | Admitting: Cardiology

## 2020-07-05 ENCOUNTER — Ambulatory Visit: Payer: Medicare Other | Admitting: Cardiology

## 2020-07-05 ENCOUNTER — Other Ambulatory Visit: Payer: Self-pay

## 2020-07-05 VITALS — BP 146/60 | HR 65 | Ht 66.0 in | Wt 248.4 lb

## 2020-07-05 DIAGNOSIS — I10 Essential (primary) hypertension: Secondary | ICD-10-CM | POA: Diagnosis not present

## 2020-07-05 DIAGNOSIS — I25118 Atherosclerotic heart disease of native coronary artery with other forms of angina pectoris: Secondary | ICD-10-CM

## 2020-07-05 DIAGNOSIS — E782 Mixed hyperlipidemia: Secondary | ICD-10-CM

## 2020-07-05 DIAGNOSIS — I712 Thoracic aortic aneurysm, without rupture: Secondary | ICD-10-CM

## 2020-07-05 DIAGNOSIS — I7121 Aneurysm of the ascending aorta, without rupture: Secondary | ICD-10-CM

## 2020-07-05 DIAGNOSIS — E088 Diabetes mellitus due to underlying condition with unspecified complications: Secondary | ICD-10-CM

## 2020-07-05 DIAGNOSIS — N1831 Chronic kidney disease, stage 3a: Secondary | ICD-10-CM

## 2020-07-05 NOTE — Patient Instructions (Addendum)
Medication Instructions:  Your physician has recommended you make the following change in your medication:   Stop Plavix  *If you need a refill on your cardiac medications before your next appointment, please call your pharmacy*   Lab Work: None ordered If you have labs (blood work) drawn today and your tests are completely normal, you will receive your results only by: [place] (if you have MyChart) OR A paper copy in the mail If you have any lab test that is abnormal or we need to change your treatment, we will call you to review the results.   Testing/Procedures: Non-Cardiac CT scanning, (CAT scanning), is a noninvasive, special x-ray that produces cross-sectional images of the body using x-rays and a computer. CT scans help physicians diagnose and treat medical conditions. For some CT exams, a contrast material is used to enhance visibility in the area of the body being studied. CT scans provide greater clarity and reveal more details than regular x-ray exams.    Follow-Up: At [facility], you and your health needs are our priority.  As part of our continuing mission to provide you with exceptional heart care, we have created designated Provider Care Teams.  These Care Teams include your primary Cardiologist (physician) and Advanced Practice Providers (APPs -  Physician Assistants and Nurse Practitioners) who all work together to provide you with the care you need, when you need it.  We recommend signing up for the patient portal called "MyChart".  Sign up information is provided on this After Visit Summary.  MyChart is used to connect with patients for Virtual Visits (Telemedicine).  Patients are able to view lab/test results, encounter notes, upcoming appointments, etc.  Non-urgent messages can be sent to your provider as well.   To learn more about what you can do with MyChart, go to [email or web address].    Your next appointment:   6 month(s)  The format for your  next appointment:   In Person  Provider:   Jyl Heinz, MD   Other Instructions NA

## 2020-07-05 NOTE — Progress Notes (Signed)
Cardiology Office Note:    Date:  07/05/2020   ID:  Robert Peterson, [place] 10-Aug-1945, MRN [id number]  PCP:  Robert Greenhouse, MD  Cardiologist:  Robert Lindau, MD   Referring MD: Robert Greenhouse, MD    ASSESSMENT:    1. Coronary artery disease of native artery of native heart with stable angina pectoris ([place])   2. Essential hypertension   3. Mixed hyperlipidemia   4. Chronic kidney disease, stage 3a ([place])   5. Diabetes mellitus due to underlying condition with unspecified complications ([place])   6. Ascending aortic aneurysm (HCC)    PLAN:    In order of problems listed above:  Coronary artery disease: Secondary prevention stressed with the patient.  Importance of compliance with diet medication stressed any vocalized understanding.  He cannot exercise much because of orthopedic issues. Ascending aortic aneurysm: We will do a CT scan of his chest for follow-up.  Without contrast. Essential hypertension: Blood pressure stable and diet was emphasized.  His blood pressures at home are fine. Mixed dyslipidemia: Lipids reviewed and findings discussed with the patient at length. Diabetes mellitus and morbid obesity: I explained to him the risks of obesity and uncontrolled diabetes.  His hemoglobin A1c on KPN sheet was markedly elevated and I cautioned him about it.  He promises to do better. We will stop dual antiplatelet therapy as its been more than a year since his stent.  He will continue aspirin. Patient will be seen in follow-up appointment in 6 months or earlier if the patient has any concerns    Medication Adjustments/Labs and Tests Ordered: Current medicines are reviewed at length with the patient today.  Concerns regarding medicines are outlined above.  No orders of the defined types were placed in this encounter.  No orders of the defined types were placed in this encounter.    No chief complaint on file.    History of Present Illness:    Robert Peterson is  a 75 y.o. male.  Patient has past medical history of coronary artery disease post stenting last year in April, essential hypertension, dyslipidemia, diabetes mellitus and obesity.  He denies any problems at this time and takes care of activities of daily living.  No chest pain orthopnea or PND.  He does not exercise on a regular basis because of orthopedic issues.  At the time of my evaluation, the patient is alert awake oriented and in no distress.  She has ascending aortic aneurysm and it is measured at 4.3 cm in April last year.  Past Medical History:  Diagnosis Date   Acute kidney injury ([place]) 04/15/2018   2020: dehydration related to septic bursitis, hosp; minor changes   Angina pectoris ([place]) 04/25/2019   Aortic valve sclerosis 04/02/2018   Ascending aortic aneurysm (HCC)    Bilateral lower extremity edema 04/16/2018   CAD (coronary artery disease)    a. Effort angina -> cath 04/2015 s/p drug-eluting stent placement to the mid LAD, residual CAD managed medically, normal LVEF   Cellulitis of right lower extremity 03/17/2018   Chronic kidney disease, stage 3a ([place]) 10/28/2019   Formatting of this note might be different from the original. 2021: SONO - medical renal disease, no obstruction   Chronic lumbar pain 08/01/2010   CKD (chronic kidney disease), stage II    COPD (chronic obstructive pulmonary disease) (HCC)    Coronary artery disease of native artery of native heart with stable angina pectoris ([place]) 04/25/2019   Formatting of  this note might be different from the original. Cardiac Cath 04/29/19 Conclusion  Dist RCA lesion is 50% stenosed.  Ost LAD to Prox LAD lesion is 60% stenosed.  Mid LAD lesion is 80% stenosed.  Post intervention, there is a 0% residual stenosis.  A drug-eluting stent was successfully placed using a STENT RESOLUTE ONYX 2.75X30.  Ost Cx to Prox Cx lesion is 50% stenosed.   Diabetes ([place])    Diabetes mellitus due to underlying condition with unspecified complications  ([place]) 2/35/5732   DOE (dyspnea on exertion) 03/17/2019   Effort angina ([place]) 04/29/2019   Encounter for laboratory testing for COVID-19 virus 05/20/2018   Formatting of this note might be different from the original. 2020: DCDMV  Formatting of this note might be different from the original. 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 2019: hosp 2021: hosp   Mild intermittent asthma without complication 02/14/5425   Mixed hyperlipidemia 05/08/2015   Morbid obesity ([place]) 03/17/2019   Murmur 02/24/2018   Orthostatic hypotension 08/02/2019   Formatting of this note might be different from the original. 2021: adm   OSA on CPAP 11/20/2015   Renal insufficiency 04/25/2019   Restless leg syndrome 05/08/2015   Septic prepatellar bursitis of right knee 04/15/2018   2020: hosp, MRSA, I/D   Swelling 02/24/2018   Tachycardia 04/02/2018   Type 2 diabetes mellitus, with long-term current use of insulin ([place]) 05/08/2015   2002: dx 6.1 XXXX: CKD3    Past Surgical History:  Procedure Laterality Date   CORONARY STENT INTERVENTION N/A 04/29/2019   Procedure: CORONARY STENT INTERVENTION;  Surgeon: Wellington Hampshire, MD;  Location: [facility];  Service: Cardiovascular;  Laterality: N/A;   INTRAVASCULAR PRESSURE WIRE/FFR STUDY N/A 04/29/2019   Procedure: INTRAVASCULAR PRESSURE WIRE/FFR STUDY;  Surgeon: Wellington Hampshire, MD;  Location: [facility];  Service: Cardiovascular;  Laterality: N/A;   LEFT HEART CATH AND CORONARY ANGIOGRAPHY N/A 04/29/2019   Procedure: LEFT HEART CATH AND CORONARY ANGIOGRAPHY;  Surgeon: Wellington Hampshire, MD;  Location: [facility];  Service: Cardiovascular;  Laterality: N/A;    Current  Medications: Current Meds  Medication Sig   amLODipine (NORVASC) 5 MG tablet Take 1 tablet (5 mg total) by mouth daily.   aspirin EC 81 MG tablet Take 1 tablet (81 mg total) by mouth daily.   atorvastatin (LIPITOR) 80 MG tablet Take 80 mg by mouth daily.    clopidogrel (PLAVIX) 75 MG tablet Take 75 mg by mouth daily.   fluticasone furoate-vilanterol (BREO ELLIPTA) 100-25 MCG/INH AEPB Inhale 1 puff into the lungs daily.   furosemide (LASIX) 40 MG tablet Take 40 mg by mouth daily.    HYDROcodone-acetaminophen (NORCO/VICODIN) 5-325 MG tablet Take 1 tablet by mouth as needed for severe pain.    irbesartan (AVAPRO) 300 MG tablet Take 300 mg by mouth every morning.   latanoprost (XALATAN) 0.005 % ophthalmic solution Place 1 drop into both eyes at bedtime.   levothyroxine (SYNTHROID) 75 MCG tablet Take 75 mcg by mouth daily.    metFORMIN (GLUCOPHAGE) 1000 MG tablet Take 1,000 mg by mouth 2 (two) times daily with a meal.    metoprolol succinate (TOPROL-XL) 50 MG 24 hr  tablet Take 50 mg by mouth daily.    nitroGLYCERIN (NITROSTAT) 0.4 MG SL tablet Place 0.4 mg under the tongue every 5 (five) minutes as needed for chest pain.   pantoprazole (PROTONIX) 40 MG tablet Take 1 tablet (40 mg total) by mouth daily.   ranolazine (RANEXA) 500 MG 12 hr tablet Take 1 tablet (500 mg total) by mouth 2 (two) times daily.   spironolactone (ALDACTONE) 25 MG tablet Take 25 mg by mouth daily.   TRESIBA FLEXTOUCH 100 UNIT/ML FlexTouch Pen Inject 55 Units into the skin at bedtime.   TRULICITY 1.5 NW/2.9FA SOPN Inject 0.5 mLs into the skin once a week.     Allergies:   Amlodipine   Social History   Socioeconomic History   Marital status: Married    Spouse name: Not on file   Number of children: Not on file   Years of education: Not on file   Highest education level: Not on file  Occupational History   Not on file  Tobacco Use   Smoking status: Never   Smokeless tobacco: Never  Substance and Sexual Activity    Alcohol use: Never   Drug use: Never   Sexual activity: Not on file  Other Topics Concern   Not on file  Social History Narrative   Not on file   Social Determinants of Health   Financial Resource Strain: Not on file  Food Insecurity: Not on file  Transportation Needs: Not on file  Physical Activity: Not on file  Stress: Not on file  Social Connections: Not on file     Family History: The patient's family history includes Cancer in his mother.  ROS:   Please see the history of present illness.    All other systems reviewed and are negative.  EKGs/Labs/Other Studies Reviewed:    The following studies were reviewed today:  EKG reveals sinus rhythm and nonspecific ST-T changes. Wellington Hampshire, MD (Primary)      Procedures  CORONARY STENT INTERVENTION  INTRAVASCULAR PRESSURE WIRE/FFR STUDY  LEFT HEART CATH AND CORONARY ANGIOGRAPHY    Conclusion    Dist RCA lesion is 50% stenosed. Ost LAD to Prox LAD lesion is 60% stenosed. Mid LAD lesion is 80% stenosed. Post intervention, there is a 0% residual stenosis. A drug-eluting stent was successfully placed using a STENT RESOLUTE ONYX 2.75X30. Ost Cx to Prox Cx lesion is 50% stenosed.   1.  Significant one-vessel coronary artery disease involving the mid LAD with moderate stenosis affecting the ostial LAD (not significant by fractional flow reserve-DFR of 0.94), moderate ostial left circumflex stenosis and moderate distal RCA stenosis. 2.  Mildly elevated left ventricular end-diastolic pressure at 13 mmHg. 3.  Successful angioplasty and drug-eluting stent placement to the mid LAD.  Very difficult procedure due to severe tortuosity of the innominate artery leading to poor support from the guide catheter.   Recommendations: Dual antiplatelet therapy for at least 6 months. Aggressive treatment of residual coronary artery disease. Avoid catheterization via right radial artery in the future due to innominate artery  tortuosity. If ostial LAD becomes significant in the future, it would be difficult to treat percutaneously as it will require pinching the ramus and the left circumflex. Given that we reached our contrast limit, I am going to hydrate him overnight to decrease the chance of contrast-induced nephropathy.     Recent Labs: 08/11/2019: BUN 27; Creatinine, Ser 1.51; Potassium 4.3; Sodium 133  Recent Lipid Panel    Component Value Date/Time  CHOL 112 05/31/2019 0834   TRIG 112 05/31/2019 0834   HDL 32 (L) 05/31/2019 0834   CHOLHDL 3.5 05/31/2019 0834   LDLCALC 59 05/31/2019 0834    Physical Exam:    VS:  BP (!) 146/60   Pulse 65   Ht 5\' 6"  (1.676 m)   Wt 248 lb 6.4 oz (112.7 kg)   SpO2 95%   BMI 40.09 kg/m     Wt Readings from Last 3 Encounters:  07/05/20 248 lb 6.4 oz (112.7 kg)  12/13/19 246 lb (111.6 kg)  08/11/19 (!) 239 lb 9.6 oz (108.7 kg)     GEN: Patient is in no acute distress HEENT: Normal NECK: No JVD; No carotid bruits LYMPHATICS: No lymphadenopathy CARDIAC: Hear sounds regular, 2/6 systolic murmur at the apex. RESPIRATORY:  Clear to auscultation without rales, wheezing or rhonchi  ABDOMEN: Soft, non-tender, non-distended MUSCULOSKELETAL:  No edema; No deformity  SKIN: Warm and dry NEUROLOGIC:  Alert and oriented x 3 PSYCHIATRIC:  Normal affect   Signed, Robert Lindau, MD  07/05/2020 3:17 PM    [facility]

## 2020-07-10 ENCOUNTER — Telehealth: Payer: Self-pay

## 2020-07-10 NOTE — Telephone Encounter (Signed)
Left VM for pt to callback for appointment time at [facility] for chest CT. Appointment is 07/13/20 Arrive at the outpatient center at 2:30 for 3:00 CT.

## 2020-07-11 NOTE — Telephone Encounter (Signed)
Left VM with appointment details for pt regarding appointment with [facility].

## 2020-07-12 NOTE — Telephone Encounter (Signed)
No answer on pt's home phone. Both of daughters phone numbers are out of service.

## 2020-07-19 ENCOUNTER — Ambulatory Visit (HOSPITAL_COMMUNITY)
Admission: RE | Admit: 2020-07-19 | Discharge: 2020-07-19 | Disposition: A | Discharge status: Home / Self Care | Payer: Medicare Other | Source: Ambulatory Visit | Attending: Cardiology | Admitting: Cardiology

## 2020-07-19 ENCOUNTER — Other Ambulatory Visit: Payer: Self-pay

## 2020-07-19 DIAGNOSIS — I712 Thoracic aortic aneurysm, without rupture: Secondary | ICD-10-CM | POA: Diagnosis not present

## 2020-07-19 DIAGNOSIS — I7121 Aneurysm of the ascending aorta, without rupture: Secondary | ICD-10-CM

## 2020-07-23 ENCOUNTER — Telehealth: Payer: Self-pay | Admitting: Cardiology

## 2020-07-23 NOTE — Telephone Encounter (Signed)
Patient called.  Left message for patient to call back. Unable to reach daughter on her number as it rings out of service.

## 2020-07-23 NOTE — Telephone Encounter (Signed)
    Pt's daughter is calling back she gave her correct number [phone]

## 2020-07-23 NOTE — Telephone Encounter (Signed)
Results reviewed with Lattie Haw per DPR as per Dr. Julien Nordmann note.  Lattie Haw verbalized understanding and had no additional questions. Routed to PCP.

## 2020-07-23 NOTE — Telephone Encounter (Signed)
     Pt's daughter returning call to get CT result, she gave # [phone]

## 2020-07-24 DIAGNOSIS — K8020 Calculus of gallbladder without cholecystitis without obstruction: Secondary | ICD-10-CM

## 2020-07-24 DIAGNOSIS — D3502 Benign neoplasm of left adrenal gland: Secondary | ICD-10-CM

## 2020-07-24 DIAGNOSIS — I700 Atherosclerosis of aorta: Secondary | ICD-10-CM

## 2020-07-24 HISTORY — DX: Atherosclerosis of aorta: I70.0

## 2020-07-24 HISTORY — DX: Calculus of gallbladder without cholecystitis without obstruction: K80.20

## 2020-07-24 HISTORY — DX: Benign neoplasm of left adrenal gland: D35.02

## 2020-08-28 ENCOUNTER — Other Ambulatory Visit: Payer: Self-pay

## 2020-08-28 MED ORDER — SPIRONOLACTONE 25 MG PO TABS: 25.0000 mg | ORAL_TABLET | Freq: Every day | ORAL | 2 refills | Status: DC

## 2020-09-14 ENCOUNTER — Other Ambulatory Visit: Payer: Self-pay | Admitting: Cardiology

## 2020-09-14 DIAGNOSIS — I10 Essential (primary) hypertension: Secondary | ICD-10-CM

## 2020-09-14 NOTE — Telephone Encounter (Signed)
Amlodipine 5 mg # 90 x 3 refills sent to [facility], [place]

## 2020-10-25 ENCOUNTER — Other Ambulatory Visit: Payer: Self-pay | Admitting: Cardiology

## 2020-11-16 DIAGNOSIS — J042 Acute laryngotracheitis: Secondary | ICD-10-CM

## 2020-11-16 HISTORY — DX: Acute laryngotracheitis: J04.2

## 2021-01-30 ENCOUNTER — Other Ambulatory Visit: Payer: Self-pay | Admitting: Cardiology

## 2021-03-19 ENCOUNTER — Encounter: Payer: Self-pay | Admitting: Cardiology

## 2021-03-19 ENCOUNTER — Other Ambulatory Visit: Payer: Self-pay

## 2021-03-19 ENCOUNTER — Ambulatory Visit: Payer: Medicare Other | Admitting: Cardiology

## 2021-03-19 VITALS — BP 158/80 | HR 62 | Ht 66.0 in | Wt 261.8 lb

## 2021-03-19 DIAGNOSIS — I25118 Atherosclerotic heart disease of native coronary artery with other forms of angina pectoris: Secondary | ICD-10-CM

## 2021-03-19 DIAGNOSIS — I10 Essential (primary) hypertension: Secondary | ICD-10-CM | POA: Diagnosis not present

## 2021-03-19 DIAGNOSIS — I700 Atherosclerosis of aorta: Secondary | ICD-10-CM

## 2021-03-19 DIAGNOSIS — I7121 Aneurysm of the ascending aorta, without rupture: Secondary | ICD-10-CM | POA: Diagnosis not present

## 2021-03-19 DIAGNOSIS — I361 Nonrheumatic tricuspid (valve) insufficiency: Secondary | ICD-10-CM | POA: Diagnosis not present

## 2021-03-19 DIAGNOSIS — E088 Diabetes mellitus due to underlying condition with unspecified complications: Secondary | ICD-10-CM

## 2021-03-19 DIAGNOSIS — E782 Mixed hyperlipidemia: Secondary | ICD-10-CM

## 2021-03-19 NOTE — Progress Notes (Signed)
Cardiology Office Note:    Date:  03/19/2021   ID:  Robert Peterson, DOB 1945-04-06, MRN [id number]  PCP:  Algis Greenhouse, MD  Cardiologist:  Jenean Lindau, MD   Referring MD: Algis Greenhouse, MD    ASSESSMENT:    1. Aortic atherosclerosis ([place])   2. Aneurysm of ascending aorta without rupture   3. Coronary artery disease of native artery of native heart with stable angina pectoris ([place])   4. Essential hypertension   5. Mixed hyperlipidemia   6. Primary hypertension   7. Diabetes mellitus due to underlying condition with unspecified complications ([place])    PLAN:    In order of problems listed above:  Dyspnea on exertion: Congestive heart failure: Heart failure with preserved ejection fraction: I discussed my findings with the patient at extensive length.  Patient has significant bilateral pedal edema.  In view of this I recommended that they go to the emergency room for inpatient admission and treatment for congestive heart failure.  He is agreeable.  I discussed his case with Dr. Graylon Good the emergency room physician for needful.  I think she will also benefit from a couple of days of intravenous diuresis.  At some point in the future he may need CAD evaluation for progression of disease. Bilateral pedal edema left greater than right: I think he would benefit from a DVT study just to make sure there is no venous thrombus that is causing his symptoms. Coronary artery disease: Stable at this time and he will benefit from monitoring. Ascending aortic aneurysm: Symptoms of chest pain are not present at this time.  Clinically to be stable.  Details of report are mentioned below. Further recommendations will depend on the course of the hospital stay.  His blood pressure is elevated and might need monitoring.   Medication Adjustments/Labs and Tests Ordered: Current medicines are reviewed at length with the patient today.  Concerns regarding medicines are outlined above.  No orders of  the defined types were placed in this encounter.  No orders of the defined types were placed in this encounter.    No chief complaint on file.    History of Present Illness:    Robert Peterson is a 76 y.o. male.  Patient has past medical history of coronary artery disease post stenting and the details are mentioned below, essential hypertension, mixed dyslipidemia, renal insufficiency, aortic aneurysm.  He mentions to me that he has been getting shortness of breath.  He states this is very significant now and it causes an issue with him even with minimal ambulation.  No chest pain orthopnea or PND.  At the time of my evaluation, the patient is alert awake oriented and in no distress.  EKG  Past Medical History:  Diagnosis Date   Acute kidney injury ([place]) 04/15/2018   2020: dehydration related to septic bursitis, hosp; minor changes   Acute laryngotracheitis 11/16/2020   Formatting of this note might be different from the original. 11/16/2020   Adrenal adenoma, left 07/24/2020   Formatting of this note might be different from the original. 2022: CT stable 11 mm   Angina pectoris ([place]) 04/25/2019   Aortic atherosclerosis ([place]) 07/24/2020   Formatting of this note might be different from the original. 2022: incidental on CT   Aortic valve sclerosis 04/02/2018   Ascending aortic aneurysm    Bilateral lower extremity edema 04/16/2018   CAD (coronary artery disease)    a. Effort angina -> cath 04/2015 s/p drug-eluting  stent placement to the mid LAD, residual CAD managed medically, normal LVEF   Calculus of gallbladder without cholecystitis without obstruction 07/24/2020   Formatting of this note might be different from the original. 2022: incidental on CT   Cellulitis of right lower extremity 03/17/2018   Chronic kidney disease, stage 3a ([place]) 10/28/2019   Formatting of this note might be different from the original. 2021: SONO - medical renal disease, no obstruction   Chronic lumbar pain  08/01/2010   CKD (chronic kidney disease), stage II    COPD (chronic obstructive pulmonary disease) (HCC)    Coronary artery disease of native artery of native heart with stable angina pectoris ([place]) 04/25/2019   Formatting of this note might be different from the original. Cardiac Cath 04/29/19 Conclusion  Dist RCA lesion is 50% stenosed.  Ost LAD to Prox LAD lesion is 60% stenosed.  Mid LAD lesion is 80% stenosed.  Post intervention, there is a 0% residual stenosis.  A drug-eluting stent was successfully placed using a STENT RESOLUTE ONYX 2.75X30.  Ost Cx to Prox Cx lesion is 50% stenosed.   Diabetes ([place])    Diabetes mellitus due to underlying condition with unspecified complications ([place]) 0/24/0973   DOE (dyspnea on exertion) 03/17/2019   Effort angina ([place]) 04/29/2019   Encounter for laboratory testing for COVID-19 virus 05/20/2018   Formatting of this note might be different from the original. 2020: DCDMV  Formatting of this note might be different from the original. 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 2019: hosp 2021: hosp   Mild intermittent asthma without complication 5/32/9924   Mixed hyperlipidemia 05/08/2015   Morbid obesity ([place]) 03/17/2019   Murmur 02/24/2018   Orthostatic hypotension 08/02/2019   Formatting of this note might be different from the original. 2021: adm   OSA on CPAP 11/20/2015   Renal insufficiency 04/25/2019   Restless leg syndrome 05/08/2015   Septic prepatellar bursitis of right knee 04/15/2018   2020: hosp, MRSA, I/D   Swelling 02/24/2018   Tachycardia 04/02/2018   Type 2 diabetes mellitus, with long-term current use of insulin ([place]) 05/08/2015   2002: dx 6.1 XXXX: CKD3     Past Surgical History:  Procedure Laterality Date   CORONARY STENT INTERVENTION N/A 04/29/2019   Procedure: CORONARY STENT INTERVENTION;  Surgeon: Wellington Hampshire, MD;  Location: [facility];  Service: Cardiovascular;  Laterality: N/A;   INTRAVASCULAR PRESSURE WIRE/FFR STUDY N/A 04/29/2019   Procedure: INTRAVASCULAR PRESSURE WIRE/FFR STUDY;  Surgeon: Wellington Hampshire, MD;  Location: [facility];  Service: Cardiovascular;  Laterality: N/A;   LEFT HEART CATH AND CORONARY ANGIOGRAPHY N/A 04/29/2019   Procedure: LEFT HEART CATH AND CORONARY ANGIOGRAPHY;  Surgeon: Wellington Hampshire, MD;  Location: [facility];  Service: Cardiovascular;  Laterality: N/A;    Current Medications: Current Meds  Medication Sig   amLODipine (NORVASC) 5 MG tablet TAKE ONE TABLET BY MOUTH DAILY   aspirin EC 81 MG tablet Take 1 tablet (81 mg total) by mouth daily.   atorvastatin (LIPITOR) 80 MG tablet Take 80 mg by mouth daily.    clopidogrel (PLAVIX) 75 MG tablet TAKE ONE TABLET BY MOUTH DAILY   fluticasone furoate-vilanterol (  BREO ELLIPTA) 100-25 MCG/INH AEPB Inhale 1 puff into the lungs daily.   furosemide (LASIX) 40 MG tablet Take 20 mg by mouth every evening.   HYDROcodone-acetaminophen (NORCO/VICODIN) 5-325 MG tablet Take 1 tablet by mouth as needed for severe pain.    irbesartan (AVAPRO) 300 MG tablet Take 300 mg by mouth every morning.   latanoprost (XALATAN) 0.005 % ophthalmic solution Place 1 drop into both eyes at bedtime.   levothyroxine (SYNTHROID) 75 MCG tablet Take 75 mcg by mouth daily.    metFORMIN (GLUCOPHAGE) 1000 MG tablet Take 1,000 mg by mouth 2 (two) times daily with a meal.    metoprolol succinate (TOPROL-XL) 50 MG 24 hr tablet Take 50 mg by mouth daily.    nitroGLYCERIN (NITROSTAT) 0.4 MG SL tablet Place 0.4 mg under the tongue every 5 (five) minutes as needed for chest pain.   pantoprazole (PROTONIX) 40 MG tablet Take 1 tablet (40 mg total) by mouth daily.    ranolazine (RANEXA) 500 MG 12 hr tablet Take 1 tablet (500 mg total) by mouth 2 (two) times daily.   spironolactone (ALDACTONE) 25 MG tablet Take 1 tablet (25 mg total) by mouth daily.   TRESIBA FLEXTOUCH 100 UNIT/ML FlexTouch Pen Inject 55 Units into the skin at bedtime.   TRULICITY 1.5 ON/6.2XB SOPN Inject 0.5 mLs into the skin once a week.     Allergies:   Amlodipine   Social History   Socioeconomic History   Marital status: Married    Spouse name: Not on file   Number of children: Not on file   Years of education: Not on file   Highest education level: Not on file  Occupational History   Not on file  Tobacco Use   Smoking status: Never   Smokeless tobacco: Never  Substance and Sexual Activity   Alcohol use: Never   Drug use: Never   Sexual activity: Not on file  Other Topics Concern   Not on file  Social History Narrative   Not on file   Social Determinants of Health   Financial Resource Strain: Not on file  Food Insecurity: Not on file  Transportation Needs: Not on file  Physical Activity: Not on file  Stress: Not on file  Social Connections: Not on file     Family History: The patient's family history includes Cancer in his mother.  ROS:   Please see the history of present illness.    All other systems reviewed and are negative.  EKGs/Labs/Other Studies Reviewed:    The following studies were reviewed today: IMPRESSIONS     1. Left ventricular ejection fraction, by estimation, is 60 to 65%. The  left ventricle has normal function. The left ventricle has no regional  wall motion abnormalities. There is severe concentric left ventricular  hypertrophy. Left ventricular diastolic   parameters are consistent with Grade II diastolic dysfunction  (pseudonormalization).   2. Right ventricular systolic function is normal. The right ventricular  size is normal.   3. The mitral valve is normal in structure. Trivial mitral valve  regurgitation. No evidence of  mitral stenosis.   4. The aortic valve is mildly thickened and mildy calcified. Aortic valve  regurgitation is trivial. Mild aortic valve sclerosis is present, with no  evidence of aortic valve stenosis.   5. Aneurysm of the ascending aorta, measuring 41 mm.   6. The inferior vena cava is normal in size with greater than 50%  respiratory variability, suggesting right atrial pressure of 3 mmHg.  Wellington Hampshire, MD (Primary)     Procedures  CORONARY STENT INTERVENTION  INTRAVASCULAR PRESSURE WIRE/FFR STUDY  LEFT HEART CATH AND CORONARY ANGIOGRAPHY   Conclusion    Dist RCA lesion is 50% stenosed. Ost LAD to Prox LAD lesion is 60% stenosed. Mid LAD lesion is 80% stenosed. Post intervention, there is a 0% residual stenosis. A drug-eluting stent was successfully placed using a STENT RESOLUTE ONYX 2.75X30. Ost Cx to Prox Cx lesion is 50% stenosed.   1.  Significant one-vessel coronary artery disease involving the mid LAD with moderate stenosis affecting the ostial LAD (not significant by fractional flow reserve-DFR of 0.94), moderate ostial left circumflex stenosis and moderate distal RCA stenosis. 2.  Mildly elevated left ventricular end-diastolic pressure at 13 mmHg. 3.  Successful angioplasty and drug-eluting stent placement to the mid LAD.  Very difficult procedure due to severe tortuosity of the innominate artery leading to poor support from the guide catheter.   Recommendations: Dual antiplatelet therapy for at least 6 months. Aggressive treatment of residual coronary artery disease. Avoid catheterization via right radial artery in the future due to innominate artery tortuosity. If ostial LAD becomes significant in the future, it would be difficult to treat percutaneously as it will require pinching the ramus and the left circumflex. Given that we reached our contrast limit, I am going to hydrate him overnight to decrease the chance of contrast-induced  nephropathy.   IMPRESSION: 1. Ascending thoracic aorta measures 4.3 cm diameter, increased from 3.9 cm on 04/18/2019. Recommend annual imaging followup by CTA or MRA. This recommendation follows 2010 ACCF/AHA/AATS/ACR/ASA/SCA/SCAI/SIR/STS/SVM Guidelines for the Diagnosis and Management of Patients with Thoracic Aortic Disease. Circulation. 2010; 121: V694-H038. Aortic aneurysm NOS (ICD10-I71.9) 2. 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 3. Cholelithiasis. 4. Aortic Atherosclerosis (ICD10-I70.0).     Electronically Signed   By: Misty Stanley M.D.   On: 07/20/2020 12:36     Recent Labs: No results found for requested labs within last 8760 hours.  Recent Lipid Panel    Component Value Date/Time   CHOL 112 05/31/2019 0834   TRIG 112 05/31/2019 0834   HDL 32 (L) 05/31/2019 0834   CHOLHDL 3.5 05/31/2019 0834   LDLCALC 59 05/31/2019 0834    Physical Exam:    VS:  BP (!) 158/80    Pulse 62    Ht '5\' 6"'$  (1.676 m)    Wt 261 lb 12.8 oz (118.8 kg)    SpO2 96%    BMI 42.26 kg/m     Wt Readings from Last 3 Encounters:  03/19/21 261 lb 12.8 oz (118.8 kg)  07/05/20 248 lb 6.4 oz (112.7 kg)  12/13/19 246 lb (111.6 kg)     GEN: Patient is in no acute distress HEENT: Normal NECK: No JVD; No carotid bruits LYMPHATICS: No lymphadenopathy CARDIAC: Hear sounds regular, 2/6 systolic murmur at the apex. RESPIRATORY:  Clear to auscultation without rales, wheezing or rhonchi  ABDOMEN: Soft, non-tender, non-distended MUSCULOSKELETAL: Bilateral pedal edema left greater than right; No deformity  SKIN: Warm and dry NEUROLOGIC:  Alert and oriented x 3 PSYCHIATRIC:  Normal affect   Signed, Jenean Lindau, MD  03/19/2021 3:37 PM    [facility]

## 2021-03-20 DIAGNOSIS — N179 Acute kidney failure, unspecified: Secondary | ICD-10-CM

## 2021-03-20 DIAGNOSIS — I2510 Atherosclerotic heart disease of native coronary artery without angina pectoris: Secondary | ICD-10-CM

## 2021-03-20 DIAGNOSIS — I10 Essential (primary) hypertension: Secondary | ICD-10-CM | POA: Diagnosis not present

## 2021-03-20 DIAGNOSIS — G4733 Obstructive sleep apnea (adult) (pediatric): Secondary | ICD-10-CM

## 2021-03-20 DIAGNOSIS — I50810 Right heart failure, unspecified: Secondary | ICD-10-CM | POA: Diagnosis not present

## 2021-03-20 DIAGNOSIS — I872 Venous insufficiency (chronic) (peripheral): Secondary | ICD-10-CM | POA: Diagnosis not present

## 2021-03-20 DIAGNOSIS — R0602 Shortness of breath: Secondary | ICD-10-CM | POA: Diagnosis not present

## 2021-03-21 DIAGNOSIS — I872 Venous insufficiency (chronic) (peripheral): Secondary | ICD-10-CM | POA: Diagnosis not present

## 2021-03-21 DIAGNOSIS — R0602 Shortness of breath: Secondary | ICD-10-CM | POA: Diagnosis not present

## 2021-03-21 DIAGNOSIS — I2510 Atherosclerotic heart disease of native coronary artery without angina pectoris: Secondary | ICD-10-CM | POA: Diagnosis not present

## 2021-03-21 DIAGNOSIS — I10 Essential (primary) hypertension: Secondary | ICD-10-CM | POA: Diagnosis not present

## 2021-03-22 DIAGNOSIS — R0602 Shortness of breath: Secondary | ICD-10-CM | POA: Diagnosis not present

## 2021-03-22 DIAGNOSIS — I872 Venous insufficiency (chronic) (peripheral): Secondary | ICD-10-CM | POA: Diagnosis not present

## 2021-03-22 DIAGNOSIS — I2510 Atherosclerotic heart disease of native coronary artery without angina pectoris: Secondary | ICD-10-CM | POA: Diagnosis not present

## 2021-03-22 DIAGNOSIS — I10 Essential (primary) hypertension: Secondary | ICD-10-CM | POA: Diagnosis not present

## 2021-03-28 DIAGNOSIS — I5031 Acute diastolic (congestive) heart failure: Secondary | ICD-10-CM

## 2021-03-28 DIAGNOSIS — E8770 Fluid overload, unspecified: Secondary | ICD-10-CM | POA: Insufficient documentation

## 2021-03-28 HISTORY — DX: Fluid overload, unspecified: E87.70

## 2021-03-28 HISTORY — DX: Acute diastolic (congestive) heart failure: I50.31

## 2021-04-10 DIAGNOSIS — L603 Nail dystrophy: Secondary | ICD-10-CM | POA: Insufficient documentation

## 2021-04-10 DIAGNOSIS — E1142 Type 2 diabetes mellitus with diabetic polyneuropathy: Secondary | ICD-10-CM | POA: Insufficient documentation

## 2021-04-10 DIAGNOSIS — L84 Corns and callosities: Secondary | ICD-10-CM | POA: Insufficient documentation

## 2021-04-10 DIAGNOSIS — B351 Tinea unguium: Secondary | ICD-10-CM | POA: Insufficient documentation

## 2021-04-12 ENCOUNTER — Encounter: Payer: Self-pay | Admitting: Cardiology

## 2021-04-12 ENCOUNTER — Ambulatory Visit: Payer: Medicare Other | Admitting: Cardiology

## 2021-04-12 VITALS — BP 140/82 | HR 54 | Ht 69.5 in | Wt 251.2 lb

## 2021-04-12 DIAGNOSIS — I700 Atherosclerosis of aorta: Secondary | ICD-10-CM | POA: Diagnosis not present

## 2021-04-12 DIAGNOSIS — I25118 Atherosclerotic heart disease of native coronary artery with other forms of angina pectoris: Secondary | ICD-10-CM

## 2021-04-12 DIAGNOSIS — I10 Essential (primary) hypertension: Secondary | ICD-10-CM

## 2021-04-12 DIAGNOSIS — I5031 Acute diastolic (congestive) heart failure: Secondary | ICD-10-CM

## 2021-04-12 DIAGNOSIS — E782 Mixed hyperlipidemia: Secondary | ICD-10-CM

## 2021-04-12 DIAGNOSIS — E088 Diabetes mellitus due to underlying condition with unspecified complications: Secondary | ICD-10-CM

## 2021-04-12 NOTE — Progress Notes (Signed)
?Cardiology Office Note:   ? ?Date:  04/12/2021  ? ?ID:  Robert Peterson, DOB 09/03/1945, MRN [id number] ? ?PCP:  Algis Greenhouse, MD  ?Cardiologist:  Jenean Lindau, MD  ? ?Referring MD: Algis Greenhouse, MD  ? ? ?ASSESSMENT:   ? ?1. Acute heart failure with preserved ejection fraction ([place])   ?2. Aortic atherosclerosis ([place])   ?3. Coronary artery disease of native artery of native heart with stable angina pectoris ([place])   ?4. Essential hypertension   ?5. Mixed hyperlipidemia   ?6. Diabetes mellitus due to underlying condition with unspecified complications ([place])   ?7. Morbid obesity ([place])   ? ?PLAN:   ? ?In order of problems listed above: ? ?Coronary artery disease post stenting: Stable at this time.  Patient does not have any chest pain or any such symptoms.  He is comfortable with ambulation and activities of daily living. ?Heart failure with preserved ejection fraction: Congestive heart failure education given to the patient.  Salt intake issues were discussed and he vocalized understanding and questions were answered to his satisfaction. ?Essential hypertension: Blood pressure stable and diet was emphasized. ?Mixed dyslipidemia and diabetes mellitus and obesity: I discussed lifestyle modification at extensive length and he promises to do better. ?He will have a Chem-7 today as he is on multiple medications.  Also he will stop his aspirin and continue Plavix.  It has been more than a year since his coronary stenting.  I also discussed with him about his renal insufficiency issues and he has an appointment coming up with his nephrologist. ?Patient will be seen in follow-up appointment in 3 months or earlier if the patient has any concerns ? ? ? ?Medication Adjustments/Labs and Tests Ordered: ?Current medicines are reviewed at length with the patient today.  Concerns regarding medicines are outlined above.  ?No orders of the defined types were placed in this encounter. ? ?No orders of the defined types were  placed in this encounter. ? ? ? ?Chief Complaint  ?Patient presents with  ? Hospitalization Follow-up  ?  ? ?History of Present Illness:   ? ?Robert Peterson is a 76 y.o. male.  Patient has past medical history of coronary artery disease post stenting, essential hypertension, mixed dyslipidemia, diabetes mellitus, obesity and sedentary lifestyle and advanced renal insufficiency.  He was sent by me to the hospital last time.  He denies any problems at this time.  He is shortness of breath is much better.  He was admitted to the hospital with congestive heart failure with preserved systolic function.  He checks his weight on a regular basis his wife mentions to me that he is very cautious about salt intake and such issues.  At the time of my evaluation, the patient is alert awake oriented and in no distress. ? ?Past Medical History:  ?Diagnosis Date  ? Acute heart failure with preserved ejection fraction ([place]) 03/28/2021  ? Formatting of this note might be different from the original. 03/2021  ? Acute kidney injury ([place]) 04/15/2018  ? 2020: dehydration related to septic bursitis, hosp; minor changes  ? 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 Formatting of this note might be different from the original. 2022: CT stable 11 mm  ? Angina pectoris ([place]) 04/25/2019  ? Aortic atherosclerosis ([place]) 07/24/2020  ? Formatting of this note might be different from the original. 2022: incidental on CT  ? Aortic valve sclerosis 04/02/2018  ? Ascending aortic aneurysm   ? Bilateral lower extremity edema  04/16/2018  ? CAD (coronary artery disease)   ? a. Effort angina -> cath 04/2015 s/p drug-eluting stent placement to the mid LAD, residual CAD managed medically, normal LVEF  ? Calculus of gallbladder without cholecystitis without obstruction 07/24/2020  ? Formatting of this note might be different from the original. 2022: incidental on CT  ? Cellulitis of right lower extremity 03/17/2018  ? Chronic kidney disease, stage 3a ([place])  10/28/2019  ? Formatting of this note might be different from the original. 2021: SONO - medical renal disease, no obstruction  ? Chronic lumbar pain 08/01/2010  ? CKD (chronic kidney disease), stage II   ? COPD (chronic obstructive pulmonary disease) ([place])   ? Coronary artery disease of native artery of native heart with stable angina pectoris ([place]) 04/25/2019  ? Formatting of this note might be different from the original. Cardiac Cath 04/29/19 Conclusion ? Dist RCA lesion is 50% stenosed. ? Ost LAD to Prox LAD lesion is 60% stenosed. ? Mid LAD lesion is 80% stenosed. ? Post intervention, there is a 0% residual stenosis. ? A drug-eluting stent was successfully placed using a STENT RESOLUTE ONYX 2.75X30. ? Ost Cx to Prox Cx lesion is 50% stenosed.  ? Diabetes ([place])   ? 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 DOE (dyspnea on exertion) 03/17/2019  ? Effort angina ([place]) 04/29/2019  ? Encounter for laboratory testing for COVID-19 virus 05/20/2018  ? Formatting of this note might be different from the original. 2020: DCDMV  Formatting of this note might be different from the original. 2020  ? Erectile dysfunction 02/16/2016  ? Essential hypertension 05/08/2015  ? 1991: dx  ? Gastroesophageal reflux disease without esophagitis 05/08/2015  ? Glaucoma 05/08/2015  ? Hyperlipidemia   ? Hypertension   ? Hypothyroidism 05/08/2015  ? 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 Formatting of this note might be different from the original. 2019: hosp 2021: hosp  ? Mild intermittent asthma without complication 34/19/6222  ? Mixed hyperlipidemia 05/08/2015  ? Morbid obesity ([place]) 03/17/2019  ? Murmur 02/24/2018  ? Orthostatic hypotension 08/02/2019  ? Formatting of this note might be different from the original. 2021: adm  ? OSA on CPAP 11/20/2015  ? Renal insufficiency 04/25/2019  ? Restless leg syndrome 05/08/2015  ? Septic prepatellar bursitis of right knee 04/15/2018  ? 2020: hosp,  MRSA, I/D  ? Swelling 02/24/2018  ? Tachycardia 04/02/2018  ? Type 2 diabetes mellitus, with long-term current use of insulin ([place]) 05/08/2015  ? 2002: dx 6.1 XXXX: CKD3  ? Volume overload 03/28/2021  ? Formatting of this note might be different from the original. 03/2021: hosp  ? ? ?Past Surgical History:  ?Procedure Laterality Date  ? CORONARY STENT INTERVENTION N/A 04/29/2019  ? Procedure: CORONARY STENT INTERVENTION;  Surgeon: Wellington Hampshire, MD;  Location: [facility];  Service: Cardiovascular;  Laterality: N/A;  ? INTRAVASCULAR PRESSURE WIRE/FFR STUDY N/A 04/29/2019  ? Procedure: INTRAVASCULAR PRESSURE WIRE/FFR STUDY;  Surgeon: Wellington Hampshire, MD;  Location: [facility];  Service: Cardiovascular;  Laterality: N/A;  ? LEFT HEART CATH AND CORONARY ANGIOGRAPHY N/A 04/29/2019  ? Procedure: LEFT HEART CATH AND CORONARY ANGIOGRAPHY;  Surgeon: Wellington Hampshire, MD;  Location: [facility];  Service: Cardiovascular;  Laterality: N/A;  ? ? ?Current Medications: ?Current Meds  ?Medication Sig  ? amLODipine (NORVASC) 5 MG tablet TAKE ONE TABLET BY MOUTH DAILY (Patient taking differently: Take 5 mg by mouth daily.)  ?  aspirin EC 81 MG tablet Take 1 tablet (81 mg total) by mouth daily.  ? atorvastatin (LIPITOR) 80 MG tablet Take 80 mg by mouth daily.   ? clopidogrel (PLAVIX) 75 MG tablet TAKE ONE TABLET BY MOUTH DAILY (Patient taking differently: Take 75 mg by mouth.)  ? fluticasone furoate-vilanterol (BREO ELLIPTA) 100-25 MCG/INH AEPB Inhale 1 puff into the lungs daily.  ? furosemide (LASIX) 40 MG tablet Take 20 mg by mouth every evening.  ? HYDROcodone-acetaminophen (NORCO/VICODIN) 5-325 MG tablet Take 1 tablet by mouth as needed for severe pain.   ? irbesartan (AVAPRO) 300 MG tablet Take 300 mg by mouth every morning.  ? latanoprost (XALATAN) 0.005 % ophthalmic solution Place 1 drop into both eyes at bedtime.  ? levothyroxine (SYNTHROID) 75 MCG tablet Take 75 mcg by mouth daily.   ? metFORMIN  (GLUCOPHAGE) 1000 MG tablet Take 1,000 mg by mouth 2 (two) times daily with a meal.   ? metoprolol succinate (TOPROL-XL) 50 MG 24 hr tablet Take 50 mg by mouth daily.   ? nitroGLYCERIN (NITROSTAT) 0.4 MG SL tabl

## 2021-04-12 NOTE — Patient Instructions (Signed)
Medication Instructions:  ?Your physician has recommended you make the following change in your medication:  ? ?STOP: Aspirin  ? ?*If you need a refill on your cardiac medications before your next appointment, please call your pharmacy* ? ? ?Lab Work: ?Your physician recommends that you have a BMP today. ? ?If you have labs (blood work) drawn today and your tests are completely normal, you will receive your results only by: ?MyChart Message (if you have MyChart) OR ?A paper copy in the mail ?If you have any lab test that is abnormal or we need to change your treatment, we will call you to review the results. ? ? ?Testing/Procedures: ?None Ordered ? ? ?Follow-Up: ?At [facility], you and your health needs are our priority.  As part of our continuing mission to provide you with exceptional heart care, we have created designated Provider Care Teams.  These Care Teams include your primary Cardiologist (physician) and Advanced Practice Providers (APPs -  Physician Assistants and Nurse Practitioners) who all work together to provide you with the care you need, when you need it. ? ?We recommend signing up for the patient portal called "MyChart".  Sign up information is provided on this After Visit Summary.  MyChart is used to connect with patients for Virtual Visits (Telemedicine).  Patients are able to view lab/test results, encounter notes, upcoming appointments, etc.  Non-urgent messages can be sent to your provider as well.   ?To learn more about what you can do with MyChart, go to [email or web address].   ? ?Your next appointment:   ?3 month(s) ? ?The format for your next appointment:   ?In Person ? ?Provider:   ?Jyl Heinz, MD  ? ? ?Other Instructions ?None ? ?

## 2021-04-16 LAB — BASIC METABOLIC PANEL
BUN/Creatinine Ratio: 17 (ref 10–24)
BUN: 31 mg/dL — ABNORMAL HIGH (ref 8–27)
CO2: 19 mmol/L — ABNORMAL LOW (ref 20–29)
Calcium: 9.4 mg/dL (ref 8.6–10.2)
Chloride: 100 mmol/L (ref 96–106)
Creatinine, Ser: 1.87 mg/dL — ABNORMAL HIGH (ref 0.76–1.27)
Glucose: 294 mg/dL — ABNORMAL HIGH (ref 70–99)
Potassium: 4.5 mmol/L (ref 3.5–5.2)
Sodium: 138 mmol/L (ref 134–144)
eGFR: 37 mL/min/{1.73_m2} — ABNORMAL LOW (ref >59)

## 2021-05-01 ENCOUNTER — Other Ambulatory Visit: Payer: Self-pay | Admitting: Cardiology

## 2021-05-10 ENCOUNTER — Other Ambulatory Visit: Payer: Self-pay | Admitting: Cardiology

## 2021-06-05 ENCOUNTER — Other Ambulatory Visit: Payer: Self-pay | Admitting: Cardiology

## 2021-06-13 DEATH — deceased

## 2021-07-15 ENCOUNTER — Ambulatory Visit: Payer: Medicare Other | Admitting: Cardiology

## 2022-02-21 IMAGING — CT CT CHEST W/O CM
2 of 4 series · 15 of 36 positions shown, 18 images · non-contrast
Comparison: 04/18/2019

CLINICAL DATA: Ascending aortic aneurysm.

EXAM:
CT CHEST WITHOUT CONTRAST
TECHNIQUE: Multidetector CT imaging of the chest was performed following the
standard protocol without IV contrast.

[Series 2: thorax · axial · 0.95mm/px · z∈[-340,-54]mm · 12 of 167 slices shown, 15 images]
[im 12/167  mediastinal]
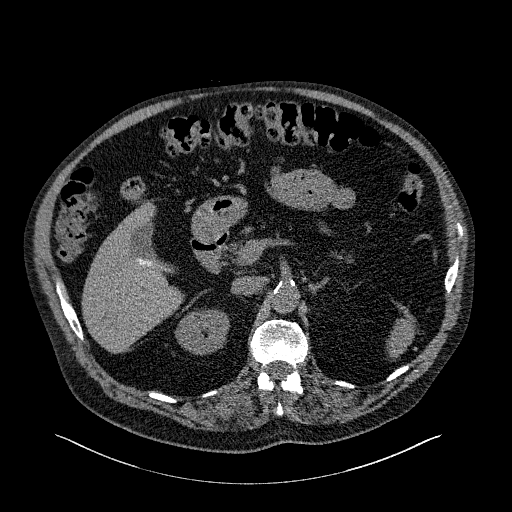
[im 12/167  lung]
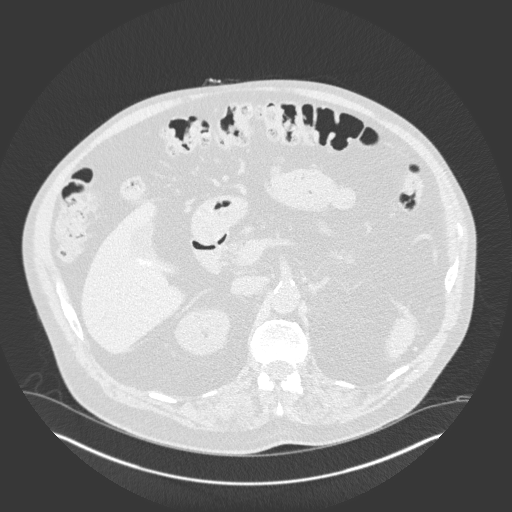
[im 24/167  lung]
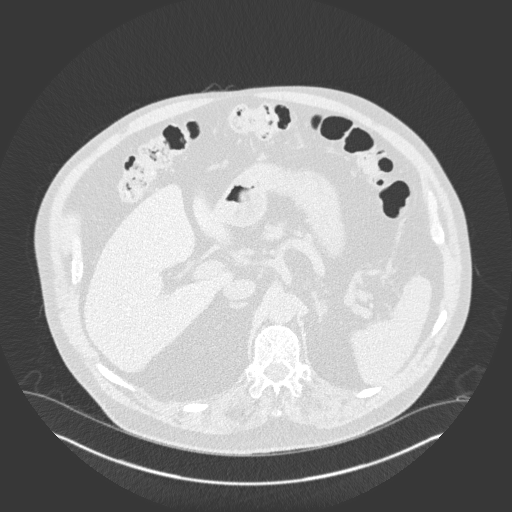
[im 36/167  lung]
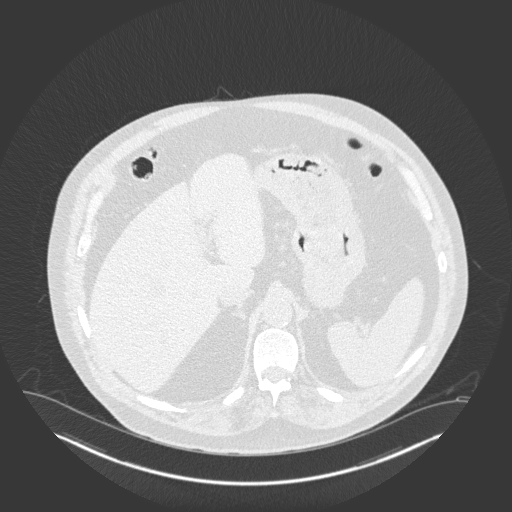
[im 48/167  lung]
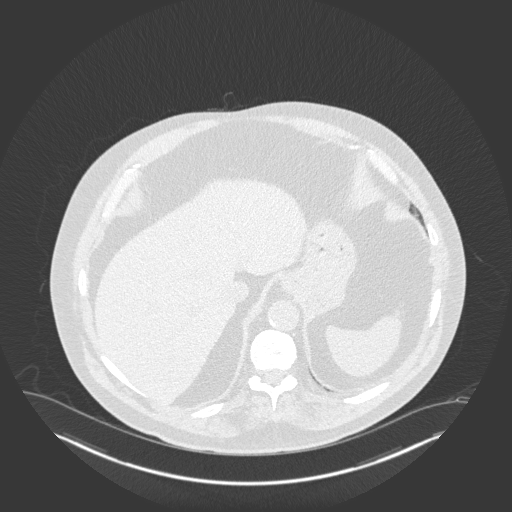
[im 60/167  mediastinal]
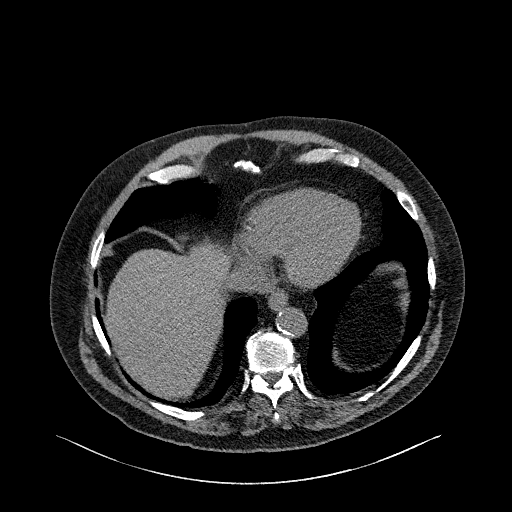
[im 60/167  lung]
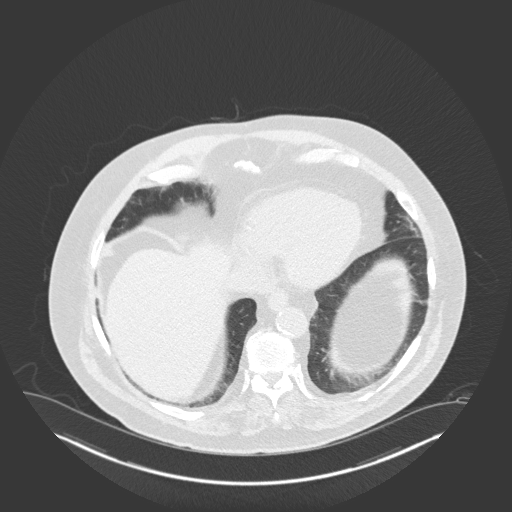
[im 72/167  lung]
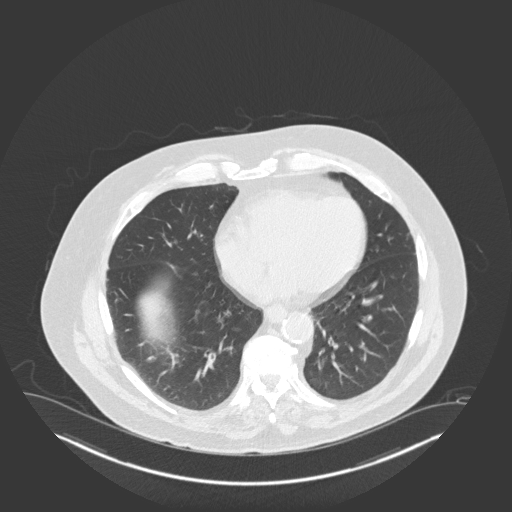
[im 95/167  lung]
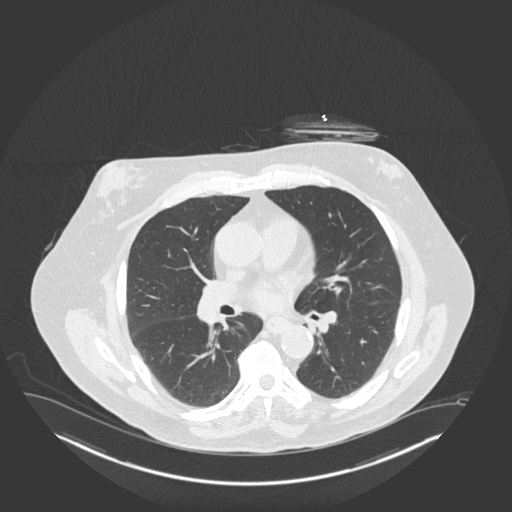
[im 107/167  lung]
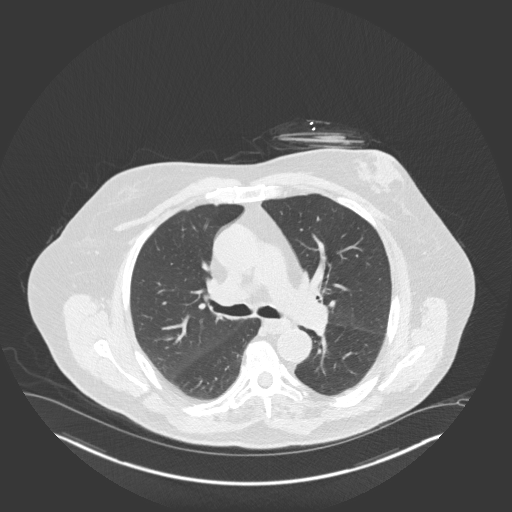
[im 119/167  mediastinal]
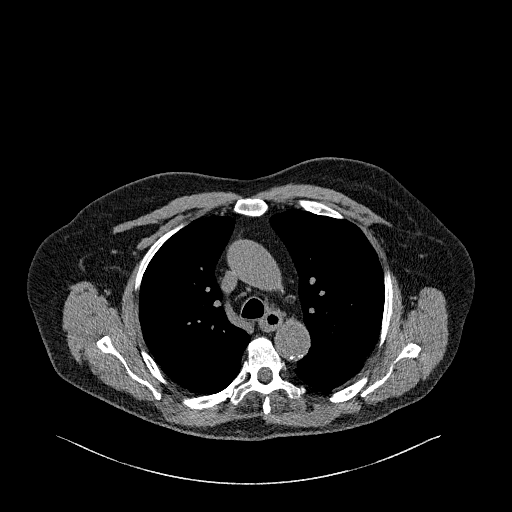
[im 119/167  lung]
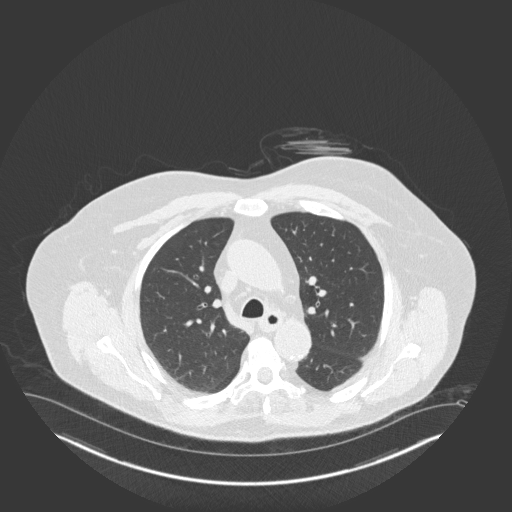
[im 131/167  lung]
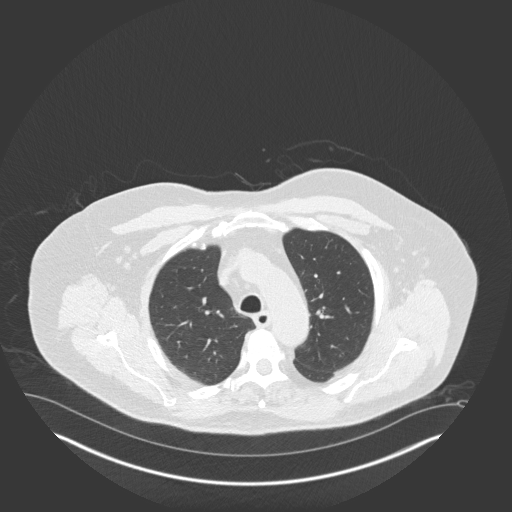
[im 143/167  lung]
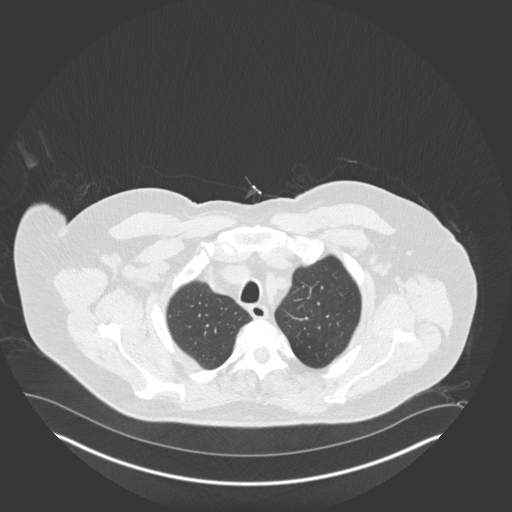
[im 155/167  lung]
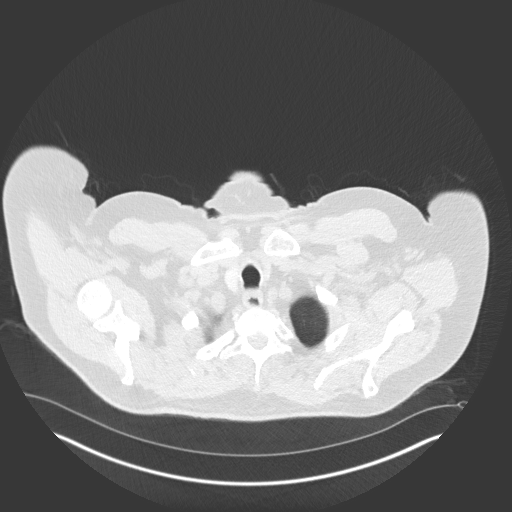

[Series 5: coronal · coronal · 0.73mm/px · 3 of 171 slices shown]
[im 35/171  lung]
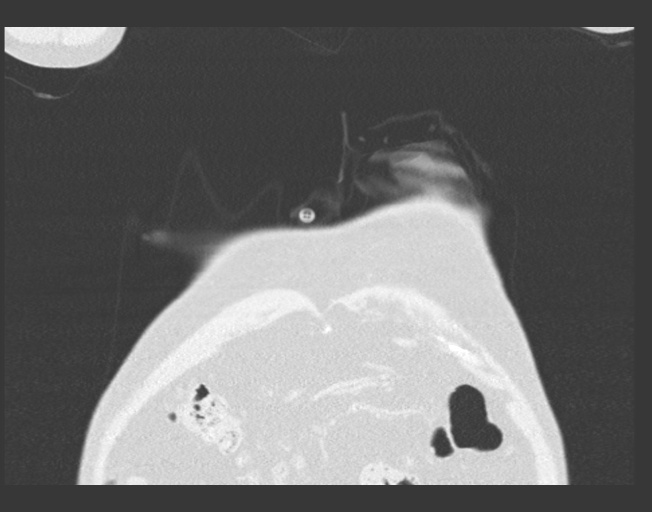
[im 69/171  lung]
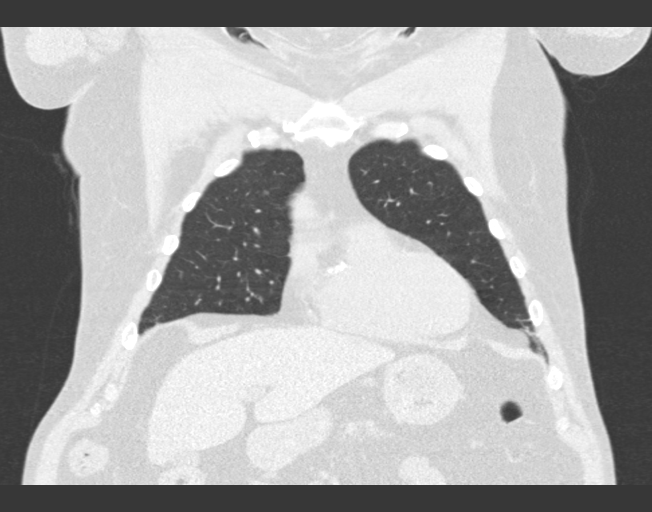
[im 103/171  lung]
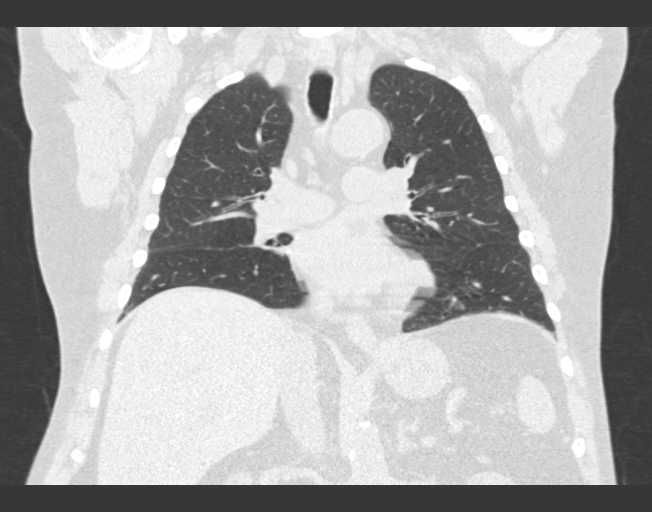

[15 of 36 positions shown; findings below may reference images not displayed]

FINDINGS: Cardiovascular: The heart size is normal. No substantial pericardial
effusion. Coronary artery calcification is evident. Mild
atherosclerotic calcification is noted in the wall of the thoracic
aorta. Ascending thoracic aorta measures 4.3 cm diameter, increased
from 3.9 cm on 04/18/2019. Measuring at the same level on CT chest
07/23/2019, the ascending aorta was 4.0 cm previously.

Mediastinum/Nodes: No mediastinal lymphadenopathy. No evidence for
gross hilar lymphadenopathy although assessment is limited by the
lack of intravenous contrast on today's study. The esophagus has
normal imaging features. There is no axillary lymphadenopathy.

Lungs/Pleura: Tiny left apical nodule on [DATE] is stable. No new
suspicious pulmonary nodule or mass. No focal airspace
consolidation. No pleural effusion.

Upper Abdomen: Layering tiny calcified gallstones evident. 11 mm
left adrenal nodule is stable, compatible with adenoma.

Musculoskeletal: No worrisome lytic or sclerotic osseous
abnormality. Bilateral gynecomastia again noted.
IMPRESSION: 1. Ascending thoracic aorta measures 4.3 cm diameter, increased from
3.9 cm on 04/18/2019. Recommend annual imaging followup by CTA or
MRA. This recommendation follows 6232
ACCF/AHA/AATS/ACR/ASA/SCA/MOOLMAN/BLAIN/PARHAM/BAXTER Guidelines for the
Diagnosis and Management of Patients with Thoracic Aortic Disease.
Circulation. 6232; 121: [id number]. Aortic aneurysm NOS ([id number])
2. Stable 11 mm left adrenal adenoma.
3. Cholelithiasis.
4. Aortic Atherosclerosis ([id number]).
